# Patient Record
Sex: Male | Born: 1935
Health system: Southern US, Community
[De-identification: ages and names within clinical notes are randomized; demographics above are authoritative.]

## PROBLEM LIST (undated history)

## (undated) DIAGNOSIS — I6529 Occlusion and stenosis of unspecified carotid artery: Secondary | ICD-10-CM

## (undated) DIAGNOSIS — R1310 Dysphagia, unspecified: Secondary | ICD-10-CM

## (undated) DIAGNOSIS — Z9889 Other specified postprocedural states: Secondary | ICD-10-CM

## (undated) DIAGNOSIS — I4891 Unspecified atrial fibrillation: Secondary | ICD-10-CM

## (undated) DIAGNOSIS — R112 Nausea with vomiting, unspecified: Secondary | ICD-10-CM

## (undated) DIAGNOSIS — N400 Enlarged prostate without lower urinary tract symptoms: Secondary | ICD-10-CM

## (undated) DIAGNOSIS — G459 Transient cerebral ischemic attack, unspecified: Secondary | ICD-10-CM

## (undated) DIAGNOSIS — I10 Essential (primary) hypertension: Secondary | ICD-10-CM

## (undated) DIAGNOSIS — I219 Acute myocardial infarction, unspecified: Secondary | ICD-10-CM

## (undated) DIAGNOSIS — K219 Gastro-esophageal reflux disease without esophagitis: Secondary | ICD-10-CM

## (undated) DIAGNOSIS — E039 Hypothyroidism, unspecified: Secondary | ICD-10-CM

## (undated) DIAGNOSIS — Z87442 Personal history of urinary calculi: Secondary | ICD-10-CM

## (undated) HISTORY — PX: UPPER GASTROINTESTINAL ENDOSCOPY: SHX188

## (undated) HISTORY — PX: CORONARY ARTERY BYPASS GRAFT: SHX141

## (undated) HISTORY — DX: Gastro-esophageal reflux disease without esophagitis: K21.9

## (undated) HISTORY — DX: Occlusion and stenosis of unspecified carotid artery: I65.29

## (undated) HISTORY — DX: Transient cerebral ischemic attack, unspecified: G45.9

## (undated) HISTORY — PX: COLONOSCOPY: SHX174

## (undated) HISTORY — DX: Essential (primary) hypertension: I10

## (undated) HISTORY — DX: Acute myocardial infarction, unspecified: I21.9

## (undated) HISTORY — DX: Dysphagia, unspecified: R13.10

## (undated) HISTORY — PX: CHOLECYSTECTOMY: SHX55

---

## 2003-03-12 ENCOUNTER — Ambulatory Visit (HOSPITAL_COMMUNITY): Admission: RE | Admit: 2003-03-12 | Discharge: 2003-03-12 | Payer: Self-pay | Admitting: Internal Medicine

## 2003-05-12 HISTORY — PX: BACK SURGERY: SHX140

## 2004-05-11 HISTORY — PX: HERNIA REPAIR: SHX51

## 2008-06-13 ENCOUNTER — Ambulatory Visit: Payer: Self-pay | Admitting: Cardiology

## 2009-06-05 ENCOUNTER — Encounter: Payer: Self-pay | Admitting: Internal Medicine

## 2009-06-18 ENCOUNTER — Inpatient Hospital Stay (HOSPITAL_COMMUNITY): Admission: EM | Admit: 2009-06-18 | Discharge: 2009-07-01 | Payer: Self-pay | Admitting: Internal Medicine

## 2009-06-18 ENCOUNTER — Ambulatory Visit: Payer: Self-pay | Admitting: Cardiovascular Disease

## 2009-06-19 ENCOUNTER — Encounter: Payer: Self-pay | Admitting: Internal Medicine

## 2009-06-20 ENCOUNTER — Ambulatory Visit: Payer: Self-pay | Admitting: Cardiothoracic Surgery

## 2009-06-20 ENCOUNTER — Encounter: Payer: Self-pay | Admitting: Cardiovascular Disease

## 2009-06-23 ENCOUNTER — Encounter: Payer: Self-pay | Admitting: Cardiology

## 2009-06-25 ENCOUNTER — Encounter: Payer: Self-pay | Admitting: Internal Medicine

## 2009-07-02 ENCOUNTER — Ambulatory Visit: Payer: Self-pay | Admitting: Cardiology

## 2009-07-05 ENCOUNTER — Ambulatory Visit: Payer: Self-pay | Admitting: Cardiology

## 2009-07-05 ENCOUNTER — Encounter (INDEPENDENT_AMBULATORY_CARE_PROVIDER_SITE_OTHER): Payer: Self-pay | Admitting: *Deleted

## 2009-07-05 DIAGNOSIS — I6529 Occlusion and stenosis of unspecified carotid artery: Secondary | ICD-10-CM | POA: Insufficient documentation

## 2009-07-05 DIAGNOSIS — I10 Essential (primary) hypertension: Secondary | ICD-10-CM | POA: Insufficient documentation

## 2009-07-05 DIAGNOSIS — I219 Acute myocardial infarction, unspecified: Secondary | ICD-10-CM | POA: Insufficient documentation

## 2009-07-05 DIAGNOSIS — G459 Transient cerebral ischemic attack, unspecified: Secondary | ICD-10-CM | POA: Insufficient documentation

## 2009-07-11 ENCOUNTER — Ambulatory Visit: Payer: Self-pay | Admitting: Internal Medicine

## 2009-07-11 DIAGNOSIS — I2510 Atherosclerotic heart disease of native coronary artery without angina pectoris: Secondary | ICD-10-CM | POA: Insufficient documentation

## 2009-07-11 DIAGNOSIS — I4891 Unspecified atrial fibrillation: Secondary | ICD-10-CM | POA: Insufficient documentation

## 2009-07-12 ENCOUNTER — Ambulatory Visit: Payer: Self-pay | Admitting: Cardiology

## 2009-07-19 ENCOUNTER — Telehealth: Payer: Self-pay | Admitting: Internal Medicine

## 2009-07-19 ENCOUNTER — Ambulatory Visit: Payer: Self-pay | Admitting: Cardiology

## 2009-07-19 LAB — CONVERTED CEMR LAB: POC INR: 2

## 2009-07-25 ENCOUNTER — Telehealth (INDEPENDENT_AMBULATORY_CARE_PROVIDER_SITE_OTHER): Payer: Self-pay | Admitting: *Deleted

## 2009-07-25 ENCOUNTER — Encounter: Admission: RE | Admit: 2009-07-25 | Discharge: 2009-07-25 | Payer: Self-pay | Admitting: Cardiothoracic Surgery

## 2009-07-25 ENCOUNTER — Ambulatory Visit: Payer: Self-pay | Admitting: Cardiothoracic Surgery

## 2009-07-30 ENCOUNTER — Ambulatory Visit: Payer: Self-pay | Admitting: Cardiology

## 2009-07-30 LAB — CONVERTED CEMR LAB: POC INR: 2.2

## 2009-08-20 ENCOUNTER — Ambulatory Visit: Payer: Self-pay | Admitting: Cardiology

## 2009-08-20 LAB — CONVERTED CEMR LAB: POC INR: 1.7

## 2009-09-06 ENCOUNTER — Ambulatory Visit: Payer: Self-pay | Admitting: Cardiology

## 2009-09-26 ENCOUNTER — Encounter: Payer: Self-pay | Admitting: Internal Medicine

## 2009-10-04 ENCOUNTER — Ambulatory Visit: Payer: Self-pay | Admitting: Cardiology

## 2009-10-04 LAB — CONVERTED CEMR LAB: POC INR: 2

## 2009-10-09 ENCOUNTER — Ambulatory Visit: Payer: Self-pay | Admitting: Internal Medicine

## 2009-10-29 ENCOUNTER — Encounter: Payer: Self-pay | Admitting: Internal Medicine

## 2009-11-05 ENCOUNTER — Ambulatory Visit: Payer: Self-pay

## 2009-11-05 LAB — CONVERTED CEMR LAB: POC INR: 2.2

## 2009-11-28 ENCOUNTER — Encounter: Payer: Self-pay | Admitting: Internal Medicine

## 2009-12-03 ENCOUNTER — Ambulatory Visit: Payer: Self-pay | Admitting: Cardiology

## 2009-12-13 ENCOUNTER — Telehealth (INDEPENDENT_AMBULATORY_CARE_PROVIDER_SITE_OTHER): Payer: Self-pay

## 2009-12-19 ENCOUNTER — Ambulatory Visit: Payer: Self-pay | Admitting: Internal Medicine

## 2009-12-24 ENCOUNTER — Ambulatory Visit: Payer: Self-pay | Admitting: Cardiology

## 2010-01-21 ENCOUNTER — Ambulatory Visit: Payer: Self-pay | Admitting: Cardiology

## 2010-01-21 LAB — CONVERTED CEMR LAB: POC INR: 2.2

## 2010-02-18 ENCOUNTER — Ambulatory Visit: Payer: Self-pay | Admitting: Cardiology

## 2010-03-18 ENCOUNTER — Ambulatory Visit: Payer: Self-pay | Admitting: Cardiology

## 2010-04-15 ENCOUNTER — Ambulatory Visit: Payer: Self-pay | Admitting: Cardiology

## 2010-04-15 LAB — CONVERTED CEMR LAB: POC INR: 1.8

## 2010-05-13 ENCOUNTER — Ambulatory Visit: Admission: RE | Admit: 2010-05-13 | Discharge: 2010-05-13 | Payer: Self-pay | Source: Home / Self Care

## 2010-05-13 LAB — CONVERTED CEMR LAB: POC INR: 2.1

## 2010-06-10 ENCOUNTER — Ambulatory Visit: Admission: RE | Admit: 2010-06-10 | Discharge: 2010-06-10 | Payer: Self-pay | Source: Home / Self Care

## 2010-06-10 LAB — CONVERTED CEMR LAB: POC INR: 2.2

## 2010-06-10 NOTE — Miscellaneous (Signed)
Summary: Chronic Cardiac Discharge Summary   Chronic Cardiac Discharge Summary   Imported By: Roderic Ovens 11/29/2009 11:42:56  _____________________________________________________________________  External Attachment:    Type:   Image     Comment:   External Document

## 2010-06-10 NOTE — Medication Information (Signed)
Summary: ccr-lr  Anticoagulant Therapy  Managed by: Vashti Hey RN PCP: Celesta Gentile, MD Supervising MD: Diona Browner MD, Remi Deter Indication 1: Atrial Fibrillation Indication 2: H/O DVT / TIA Lab Used: [facility] [place] Site: [place] INR POC 1.8  Dietary changes: no    Health status changes: no    Bleeding/hemorrhagic complications: no    Recent/future hospitalizations: no    Any changes in medication regimen? no    Recent/future dental: no  Any missed doses?: no       Is patient compliant with meds? yes       Allergies: 1)  ! * Amien  Anticoagulation Management History:      The patient is taking warfarin and comes in today for a routine follow up visit.  The patient is on warfarin for chronic atrial fibrillation.  Positive risk factors for bleeding include an age of 75 years or older and history of CVA/TIA.  Negative risk factors for bleeding include no history of GI bleeding and absence of serious comorbidities.  The bleeding index is 'intermediate risk'.  Positive CHADS2 values include History of HTN, Age > 30 years old, and Prior Stroke/CVA/TIA.  Negative CHADS2 values include History of CHF and History of Diabetes.  The start date was 06/30/2009.  Plans are to continue warfarin for life.  Anticoagulation responsible provider: Diona Browner MD, Remi Deter.  INR POC: 1.8.  Cuvette Lot#: [id number].    Anticoagulation Management Assessment/Plan:      The patient's current anticoagulation dose is Warfarin sodium 5 mg tabs: Use as directed by Anticoagulation Clinic.  The target INR is 2.0-3.0.  The next INR is due 12/02/2009.  Anticoagulation instructions were given to patient.  Results were reviewed/authorized by Vashti Hey RN.  He was notified by Vashti Hey RN.         Prior Anticoagulation Instructions: INR 2.2 Continue coumadin 2.5mg  once daily except 5mg  on Tuesdays and Fridays  Current Anticoagulation Instructions: INR 1.8 Increase coumadin to 2.5mg  once daily except  5mg  on T,Th,Sat

## 2010-06-10 NOTE — Assessment & Plan Note (Signed)
Summary: 3 month rov   Visit Type:  Follow-up Primary Provider:  Celesta Gentile, MD  CC:  nb cardiology complaints.  History of Present Illness: Danny Lucas is a pleasant 77 yo man from [place], [place] who has a h/o CAD and atrial fibrillation who underwent CABG/MAZE several weeks ago.  He has been stable post op and denies c/p, sob, or recurrent symptomatic atrial fib.  He has remained on anti-arrrhythmic medical therapy.  No syncope.  He has slowly increased his activity and is walking 10 minutes three times daily.  He has minimal peripheral edema after his CABG.  He want to come off of coumadin.   Current Medications (verified): 1)  Amiodarone Hcl 200 Mg Tabs (Amiodarone Hcl) .... Take One Tablet By Mouth Daily 2)  Aspirin 81 Mg Tbec (Aspirin) .... Take One Tablet By Mouth Daily 3)  Simvastatin 20 Mg Tabs (Simvastatin) .... One By Mouth Every Night 4)  Warfarin Sodium 5 Mg Tabs (Warfarin Sodium) .... Use As Directed By Anticoagulation Clinic 5)  Folic Acid 1 Mg Tabs (Folic Acid) .... Take One Tablet By Mouth Once Daily. 6)  Cinnamon 1000 Mg Caps (Cinnamon) .... Take One Tablet By Mouth Once Daily. 7)  Doxazosin Mesylate 4 Mg Tabs (Doxazosin Mesylate) .... Take One Tablet By Mouth Once Daily. 8)  Gabapentin 300 Mg Caps (Gabapentin) .... 2 Tabs Once Daily 9)  Metoprolol Tartrate 50 Mg Tabs (Metoprolol Tartrate) .... Take One Tablet By Mouth Twice A Day  Allergies (verified): 1)  ! Danny Lucas  Past History:  Past Medical History: Last updated: 07/05/2009 Current Problems:  CAROTID ARTERY STENOSIS (ICD-433.10) TRANSIENT ISCHEMIC ATTACK (ICD-435.9) HYPERTENSION (ICD-401.9) MI (ICD-410.90)    Past Surgical History: Last updated: 07/05/2009  DATE OF PROCEDURE:  06/24/2009  PHYSICIAN:  Sheliah Plane, MD   SURGICAL PROCEDURES:  Coronary artery bypass grafting x6 with the left   internal mammary to the left anterior descending coronary artery,   reverse saphenous vein graft to the  intermediate coronary artery,   sequential reverse saphenous vein graft to the first obtuse marginal and   distal circumflex, sequential reverse saphenous vein graft to the acute   marginal and posterior descending coronary artery branch of the right   coronary artery with left-sided Maze and stapling, obliteration of the   left atrial appendage and placement of right femoral arterial line.      Review of Systems  The patient denies chest pain, syncope, dyspnea on exertion, and peripheral edema.    Vital Signs:  Patient profile:   77 year old male Pulse rate:   59 / minute BP sitting:   127 / 69  (right arm)  Vitals Entered By: Larita Fife Via LPN (October 09, 3084 2:19 PM)  Physical Exam  General:  Elderly, well developed, well nourished, in no acute distress.  HEENT: normal Neck: supple. No JVD. Carotids 2+ bilaterally no bruits Cor: RRR no rubs, gallops or murmur. Well healed sternotomy incision. Lungs: CTA Ab: soft, nontender. nondistended. No HSM. Good bowel sounds Ext: warm. no cyanosis or clubbing. Trace edema Neuro: alert and oriented. Grossly nonfocal. affect pleasant    Impression & Recommendations:  Problem # 1:  ATRIAL FIBRILLATION (ICD-427.31) He appears to be maintaining NSR in atrial fib on coumadin.  I have asked him to continue his current meds as below.  I do not think we should stop his amiodarone or his coumadin at this time.  I might consider stopping one or the other in one year.  His updated medication list for this problem includes:    Amiodarone Hcl 200 Mg Tabs (Amiodarone hcl) .Marland Kitchen... Take one tablet by mouth daily    Aspirin 81 Mg Tbec (Aspirin) .Marland Kitchen... Take one tablet by mouth daily    Warfarin Sodium 5 Mg Tabs (Warfarin sodium) ..... Use as directed by anticoagulation clinic    Metoprolol Tartrate 50 Mg Tabs (Metoprolol tartrate) .Marland Kitchen... Take one tablet by mouth twice a day  Problem # 2:  CAD (ICD-414.00) He denies anginal symptoms.  Will continue his current  meds as below and start walking. His updated medication list for this problem includes:    Aspirin 81 Mg Tbec (Aspirin) .Marland Kitchen... Take one tablet by mouth daily    Warfarin Sodium 5 Mg Tabs (Warfarin sodium) ..... Use as directed by anticoagulation clinic    Metoprolol Tartrate 50 Mg Tabs (Metoprolol tartrate) .Marland Kitchen... Take one tablet by mouth twice a day  Problem # 3:  HYPERTENSION (ICD-401.9) A low sodium diet is requested along with meds as below. His updated medication list for this problem includes:    Aspirin 81 Mg Tbec (Aspirin) .Marland Kitchen... Take one tablet by mouth daily    Doxazosin Mesylate 4 Mg Tabs (Doxazosin mesylate) .Marland Kitchen... Take one tablet by mouth once daily.    Metoprolol Tartrate 50 Mg Tabs (Metoprolol tartrate) .Marland Kitchen... Take one tablet by mouth twice a day

## 2010-06-10 NOTE — Medication Information (Signed)
Summary: ccr-lr  Anticoagulant Therapy  Managed by: Vashti Hey RN PCP: Celesta Gentile, MD Supervising MD: Andee Lineman MD, Michelle Piper Indication 1: Atrial Fibrillation Indication 2: H/O DVT / TIA Lab Used: [facility] [place] Site: [place] INR POC 2.8  Dietary changes: no    Health status changes: no    Bleeding/hemorrhagic complications: no    Recent/future hospitalizations: no    Any changes in medication regimen? no    Recent/future dental: no  Any missed doses?: no       Is patient compliant with meds? yes       Allergies: 1)  ! * Amien  Anticoagulation Management History:      The patient is taking warfarin and comes in today for a routine follow up visit.  The patient is taking warfarin for chronic atrial fibrillation.  Positive risk factors for bleeding include an age of 78 years or older and history of CVA/TIA.  Negative risk factors for bleeding include no history of GI bleeding and absence of serious comorbidities.  The bleeding index is 'intermediate risk'.  Positive CHADS2 values include History of HTN, Age > 40 years old, and Prior Stroke/CVA/TIA.  Negative CHADS2 values include History of CHF and History of Diabetes.  The start date was 06/30/2009.  Plans are to continue warfarin for life.  Anticoagulation responsible provider: Andee Lineman MD, Michelle Piper.  INR POC: 2.8.  Cuvette Lot#: [id number].    Anticoagulation Management Assessment/Plan:      The patient's current anticoagulation dose is Warfarin sodium 5 mg tabs: Use as directed by Anticoagulation Clinic.  The target INR is 2.0-3.0.  The next INR is due 07/19/2009.  Anticoagulation instructions were given to patient.  Results were reviewed/authorized by Vashti Hey RN.  He was notified by Vashti Hey RN.        Coagulation management information includes: 06/30/09  On amiodarone 400mg  two times a day x 10 days (07/10/09) then 200mg  two times a day    May switch to Dr Leandrew Koyanagi for coumadin management after INR's stable.  Prior  Anticoagulation Instructions: INR 3.0 Decrease coumadin to 2.5mg  once daily   Current Anticoagulation Instructions: INR 2.8 Continue coumadin 2.5mg  once daily

## 2010-06-10 NOTE — Medication Information (Signed)
Summary: ccr-lr  Anticoagulant Therapy  Managed by: Vashti Hey RN PCP: Celesta Gentile, MD Supervising MD: Andee Lineman MD, Michelle Piper Indication 1: Atrial Fibrillation Indication 2: H/O DVT / TIA Lab Used: [facility] [place] Site: [place] INR POC 2.2  Dietary changes: no    Health status changes: no    Bleeding/hemorrhagic complications: no    Recent/future hospitalizations: no    Any changes in medication regimen? no    Recent/future dental: no  Any missed doses?: no       Is patient compliant with meds? yes       Allergies: 1)  ! * Amien  Anticoagulation Management History:      The patient is taking warfarin and comes in today for a routine follow up visit.  The patient is taking warfarin for chronic atrial fibrillation.  Positive risk factors for bleeding include an age of 75 years or older and history of CVA/TIA.  Negative risk factors for bleeding include no history of GI bleeding and absence of serious comorbidities.  The bleeding index is 'intermediate risk'.  Positive CHADS2 values include History of HTN, Age > 62 years old, and Prior Stroke/CVA/TIA.  Negative CHADS2 values include History of CHF and History of Diabetes.  The start date was 06/30/2009.  Plans are to continue warfarin for life.  Anticoagulation responsible provider: Andee Lineman MD, Michelle Piper.  INR POC: 2.2.  Cuvette Lot#: [id number].    Anticoagulation Management Assessment/Plan:      The patient's current anticoagulation dose is Warfarin sodium 5 mg tabs: Use as directed by Anticoagulation Clinic.  The target INR is 2.0-3.0.  The next INR is due 08/20/2009.  Anticoagulation instructions were given to patient.  Results were reviewed/authorized by Vashti Hey RN.  He was notified by Vashti Hey RN.        Coagulation management information includes: 06/30/09  On amiodarone 200mg  qd    May switch to Dr Leandrew Koyanagi for coumadin management after INR's stable.  Has appt with him 09/07/09.  Prior Anticoagulation Instructions: INR  2.0 Increase coumadin to 2.5mg  once daily except 5mg  on Fridays  Current Anticoagulation Instructions: INR 2.2 Continue coumadin 2.5mg  once daily except 5mg  on Fridays

## 2010-06-10 NOTE — Letter (Signed)
Summary: Engineer, materials at [facility]  [address]   [place], [place] [address]   Phone: [phone]  Fax: [phone]        July 05, 2009 MRN: [id number]   ETHAN CLAYBURN [address] [place], [place]  [address]   Dear Mr. VICENCIO,  Your test ordered by Selena Batten has been reviewed by your physician (or physician assistant) and was found to be normal or stable. Your physician (or physician assistant) felt no changes were needed at this time.  ____ Echocardiogram  ____ Cardiac Stress Test  ____ Lab Work  ____ Peripheral vascular study of arms, legs or neck  __X__ CT scan or X-ray (chest)  ____ Lung or Breathing test  ____ Other:   Thank you.   Hoover Brunette, LPN    Duane Boston, M.D., F.A.C.C. Thressa Sheller, M.D., F.A.C.C. Oneal Grout, M.D., F.A.C.C. Cheree Ditto, M.D., F.A.C.C. Daiva Nakayama, M.D., F.A.C.C. Kenney Houseman, M.D., F.A.C.C. Jeanne Ivan, PA-C

## 2010-06-10 NOTE — Medication Information (Signed)
Summary: ccr-lr  Anticoagulant Therapy  Managed by: Vashti Hey RN Supervising MD: Myrtis Ser MD, Tinnie Gens Indication 1: Atrial Fibrillation Indication 2: H/O DVT / TIA Lab Used: [facility] [place] Site: [place] INR POC 3.0  Dietary changes: no    Health status changes: no    Bleeding/hemorrhagic complications: no    Recent/future hospitalizations: yes       Details: s/p cabg  Any changes in medication regimen? yes       Details: on amiodarone and coumadin   Recent/future dental: no  Any missed doses?: no       Is patient compliant with meds? yes       Anticoagulation Management History:      The patient is taking warfarin and comes in today for a routine follow up visit.  Anticoagulation is being administered due to chronic atrial fibrillation.  Positive risk factors for bleeding include an age of 50 years or older and history of CVA/TIA.  Negative risk factors for bleeding include no history of GI bleeding and absence of serious comorbidities.  The bleeding index is 'intermediate risk'.  Positive CHADS2 values include History of HTN, Age > 63 years old, and Prior Stroke/CVA/TIA.  Negative CHADS2 values include History of CHF and History of Diabetes.  The start date was 06/30/2009.  Plans are to continue warfarin for life.  Anticoagulation responsible provider: Myrtis Ser MD, Tinnie Gens.  INR POC: 3.0.  Cuvette Lot#: [id number].    Anticoagulation Management Assessment/Plan:      The target INR is 2.0-3.0.  The next INR is due 07/12/2009.  Anticoagulation instructions were given to patient.  Results were reviewed/authorized by Vashti Hey RN.  He was notified by Vashti Hey RN.         Prior Anticoagulation Instructions: INR 2.4 Sent home from hospital on 5mg  once daily.  D/C INR 1.5 on 06/30/09 Take coumadin 2.5mg  Tuesday, 5mg  on Wednesday and 2.5mg  on Thursday.  Recheck INR on Friday 07/05/09. On amiodarone as above.  Current Anticoagulation Instructions: INR 3.0 Decrease coumadin to  2.5mg  once daily

## 2010-06-10 NOTE — Letter (Signed)
Summary: [facility]  [facility]   Imported By: Marylou Mccoy 10/31/2009 11:46:44  _____________________________________________________________________  External Attachment:    Type:   Image     Comment:   External Document

## 2010-06-10 NOTE — Assessment & Plan Note (Signed)
Summary: EPH   Visit Type:  Follow-up Primary Provider:  Celesta Gentile, MD   History of Present Illness: Danny Lucas is seen for the first time in our cardiology office.  He is a pleasant 77 yo man from [place], [place] who has a h/o CAD and atrial fibrillation who underwent CABG/MAZE several weeks ago.  He has been stable post op and denies c/p, sob, or recurrent symptomatic atrial fib.  He has remained on anti-arrrhythmic medical therapy.  No syncope.  He has slowly increased his activity and is walking 10 minutes three times daily.  He has minimal peripheral edema after his CABG.  Current Medications (verified): 1)  Amiodarone Hcl 200 Mg Tabs (Amiodarone Hcl) .... Take One Tablet By Mouth Twice A Day 2)  Aspirin 81 Mg Tbec (Aspirin) .... Take One Tablet By Mouth Daily 3)  Crestor 10 Mg Tabs (Rosuvastatin Calcium) .... Take One Tablet By Mouth Daily. 4)  Warfarin Sodium 5 Mg Tabs (Warfarin Sodium) .... Use As Directed By Anticoagulation Clinic 5)  Folic Acid 1 Mg Tabs (Folic Acid) .... Take One Tablet By Mouth Once Daily. 6)  Cinnamon 1000 Mg Caps (Cinnamon) .... Take One Tablet By Mouth Once Daily. 7)  Doxazosin Mesylate 4 Mg Tabs (Doxazosin Mesylate) .... Take One Tablet By Mouth Once Daily. 8)  Gabapentin 300 Mg Caps (Gabapentin) .... 2 Tabs Once Daily 9)  Metoprolol Tartrate 50 Mg Tabs (Metoprolol Tartrate) .... Take One Tablet By Mouth Twice A Day  Allergies (verified): 1)  ! Carmela Hurt  Past History:  Past Medical History: Last updated: 07/05/2009 Current Problems:  CAROTID ARTERY STENOSIS (ICD-433.10) TRANSIENT ISCHEMIC ATTACK (ICD-435.9) HYPERTENSION (ICD-401.9) MI (ICD-410.90)    Past Surgical History: Last updated: 07/05/2009  DATE OF PROCEDURE:  06/24/2009  PHYSICIAN:  Sheliah Plane, MD   SURGICAL PROCEDURES:  Coronary artery bypass grafting x6 with the left   internal mammary to the left anterior descending coronary artery,   reverse saphenous vein graft to the  intermediate coronary artery,   sequential reverse saphenous vein graft to the first obtuse marginal and   distal circumflex, sequential reverse saphenous vein graft to the acute   marginal and posterior descending coronary artery branch of the right   coronary artery with left-sided Maze and stapling, obliteration of the   left atrial appendage and placement of right femoral arterial line.      Review of Systems  The patient denies chest pain, syncope, dyspnea on exertion, and peripheral edema.    Vital Signs:  Patient profile:   77 year old male Height:      69 inches Weight:      181 pounds BMI:     26.83 Pulse (ortho):   71 / minute BP sitting:   100 / 70  (left arm)  Vitals Entered By: Laurance Flatten CMA (July 11, 2009 9:02 AM)  Physical Exam  General:  Elderly, well developed, well nourished, in no acute distress.  HEENT: normal Neck: supple. No JVD. Carotids 2+ bilaterally no bruits Cor: RRR no rubs, gallops or murmur. Well healed sternotomy incision. Lungs: CTA Ab: soft, nontender. nondistended. No HSM. Good bowel sounds Ext: warm. no cyanosis or clubbing. Trace edema Neuro: alert and oriented. Grossly nonfocal. affect pleasant    EKG  Procedure date:  07/11/2009  Findings:      Normal sinus rhythm with rate of:71.  Significant T-wave inversion noted:     Impression & Recommendations:  Problem # 1:  ATRIAL FIBRILLATION (ICD-427.31) His  ECG demonstrates NSR today.  He will continue his amiodarone and decrease his dose to 200 mg daily. His updated medication list for this problem includes:    Amiodarone Hcl 200 Mg Tabs (Amiodarone hcl) .Marland Kitchen... Take one tablet by mouth daily    Aspirin 81 Mg Tbec (Aspirin) .Marland Kitchen... Take one tablet by mouth daily    Warfarin Sodium 5 Mg Tabs (Warfarin sodium) ..... Use as directed by anticoagulation clinic    Metoprolol Tartrate 50 Mg Tabs (Metoprolol tartrate) .Marland Kitchen... Take one tablet by mouth twice a day  Problem # 2:  CAD  (ICD-414.00) He is stable s/p CABG.  He will continue his current meds.  I have encouraged him to advance his physical activity as he is able.  He is interested in cardiac rehab. His updated medication list for this problem includes:    Aspirin 81 Mg Tbec (Aspirin) .Marland Kitchen... Take one tablet by mouth daily    Warfarin Sodium 5 Mg Tabs (Warfarin sodium) ..... Use as directed by anticoagulation clinic    Metoprolol Tartrate 50 Mg Tabs (Metoprolol tartrate) .Marland Kitchen... Take one tablet by mouth twice a day  Other Orders: EKG w/ Interpretation (93000)  Patient Instructions: 1)  Your physician recommends that you schedule a follow-up appointment in: 3 months with Dr Ladona Ridgel in [place] 2)  Your physician has recommended you make the following change in your medication: decrease Amiodarone to 200mg  once daily and change Crestor to Simvastatin 20mg  one by mouth every night Prescriptions: METOPROLOL TARTRATE 50 MG TABS (METOPROLOL TARTRATE) Take one tablet by mouth twice a day  #180 x 3   Entered by:   Dennis Bast, RN, BSN   Authorized by:   Laren Boom, MD, [facility]   Signed by:   Dennis Bast, RN, BSN on 07/11/2009   Method used:   Faxed to ...       [facility] (mail-order)             , [place]         Ph: [phone]       Fax: [phone]   RxID:   [id number] AMIODARONE HCL 200 MG TABS (AMIODARONE HCL) Take one tablet by mouth daily  #90 x 3   Entered by:   Dennis Bast, RN, BSN   Authorized by:   Laren Boom, MD, [facility]   Signed by:   Dennis Bast, RN, BSN on 07/11/2009   Method used:   Faxed to ...       [facility] (mail-order)             , [place]         Ph: [phone]       Fax: [phone]   RxID:   [id number] SIMVASTATIN 20 MG TABS (SIMVASTATIN) one by mouth every night  #90 x 3   Entered by:   Dennis Bast, RN, BSN   Authorized by:   Laren Boom, MD, [facility]   Signed by:   Dennis Bast, RN, BSN on 07/11/2009   Method used:   Faxed to ...        [facility] (mail-order)             , [place]         Ph: [phone]       Fax: [phone]   RxID:   [id number] METOPROLOL TARTRATE 50 MG TABS (METOPROLOL TARTRATE) Take one tablet by mouth twice a day  #180 x 3   Entered by:  Dennis Bast, RN, BSN   Authorized by:   Laren Boom, MD, [facility]   Signed by:   Dennis Bast, RN, BSN on 07/11/2009   Method used:   Print then Give to Patient   RxID:   [id number] SIMVASTATIN 20 MG TABS (SIMVASTATIN) one by mouth every night  #90 x 3   Entered by:   Dennis Bast, RN, BSN   Authorized by:   Laren Boom, MD, [facility]   Signed by:   Dennis Bast, RN, BSN on 07/11/2009   Method used:   Print then Give to Patient   RxID:   [id number] AMIODARONE HCL 200 MG TABS (AMIODARONE HCL) Take one tablet by mouth daily  #90 x 3   Entered by:   Dennis Bast, RN, BSN   Authorized by:   Laren Boom, MD, [facility]   Signed by:   Dennis Bast, RN, BSN on 07/11/2009   Method used:   Print then Give to Patient   RxID:   [id number]   Appended Document: EPH    Clinical Lists Changes  Medications: Rx of AMIODARONE HCL 200 MG TABS (AMIODARONE HCL) Take one tablet by mouth daily;  #90 x 3;  Signed;  Entered by: Teressa Lower RN;  Authorized by: Laren Boom, MD, [facility];  Method used: Print then Give to Patient Rx of SIMVASTATIN 20 MG TABS (SIMVASTATIN) one by mouth every night;  #90 x 3;  Signed;  Entered by: Teressa Lower RN;  Authorized by: Laren Boom, MD, [facility];  Method used: Print then Give to Patient Rx of METOPROLOL TARTRATE 50 MG TABS (METOPROLOL TARTRATE) Take one tablet by mouth twice a day;  #180 x 3;  Signed;  Entered by: Teressa Lower RN;  Authorized by: Laren Boom, MD, [facility];  Method used: Print then Give to Patient    Prescriptions: METOPROLOL TARTRATE 50 MG TABS (METOPROLOL TARTRATE) Take one tablet by mouth twice a day  #180 x 3   Entered by:   Teressa Lower RN   Authorized  by:   Laren Boom, MD, [facility]   Signed by:   Teressa Lower RN on 07/17/2009   Method used:   Print then Give to Patient   RxID:   [id number] SIMVASTATIN 20 MG TABS (SIMVASTATIN) one by mouth every night  #90 x 3   Entered by:   Teressa Lower RN   Authorized by:   Laren Boom, MD, [facility]   Signed by:   Teressa Lower RN on 07/17/2009   Method used:   Print then Give to Patient   RxID:   [id number] AMIODARONE HCL 200 MG TABS (AMIODARONE HCL) Take one tablet by mouth daily  #90 x 3   Entered by:   Teressa Lower RN   Authorized by:   Laren Boom, MD, [facility] [place] [facility]   Signed by:   Teressa Lower RN on 07/17/2009   Method used:   Print then Give to Patient   RxID:   [id number]

## 2010-06-10 NOTE — Medication Information (Signed)
Summary: ccr-lr  Anticoagulant Therapy  Managed by: Vashti Hey RN PCP: Celesta Gentile, MD Supervising MD: Andee Lineman MD, Michelle Piper Indication 1: Atrial Fibrillation Indication 2: H/O DVT / TIA Lab Used: [facility] [place] Site: [place] INR POC 1.8  Dietary changes: no    Health status changes: no    Bleeding/hemorrhagic complications: no    Recent/future hospitalizations: no    Any changes in medication regimen? no    Recent/future dental: no  Any missed doses?: no       Is patient compliant with meds? yes       Allergies: 1)  ! * Amien  Anticoagulation Management History:      The patient is taking warfarin and comes in today for a routine follow up visit.  He is being anticoagulated because of chronic atrial fibrillation.  Positive risk factors for bleeding include an age of 75 years or older and history of CVA/TIA.  Negative risk factors for bleeding include no history of GI bleeding and absence of serious comorbidities.  The bleeding index is 'intermediate risk'.  Positive CHADS2 values include History of HTN, Age > 2 years old, and Prior Stroke/CVA/TIA.  Negative CHADS2 values include History of CHF and History of Diabetes.  The start date was 06/30/2009.  Plans are to continue warfarin for life.  Anticoagulation responsible provider: Andee Lineman MD, Michelle Piper.  INR POC: 1.8.  Cuvette Lot#: [id number].    Anticoagulation Management Assessment/Plan:      The patient's current anticoagulation dose is Warfarin sodium 5 mg tabs: Use as directed by Anticoagulation Clinic.  The target INR is 2.0-3.0.  The next INR is due 04/15/2010.  Anticoagulation instructions were given to patient.  Results were reviewed/authorized by Vashti Hey RN.  He was notified by Vashti Hey RN.         Prior Anticoagulation Instructions: INR 2.3 Continue coumadin 2.5mg  once daily except 5mg  on T,Th,Sat  Current Anticoagulation Instructions: INR 1.8 Take coumadin 1 tablet tonight then resume 1/2 tablet once  daily except 1 tablet on T,Th,Sat

## 2010-06-10 NOTE — Medication Information (Signed)
Summary: [facility] 2/21 DR. TAYLOR  Anticoagulant Therapy  Managed by: Vashti Hey RN Supervising MD: Andee Lineman MD, Michelle Piper Indication 1: Atrial Fibrillation Indication 2: H/O DVT / TIA Lab Used: [facility] [place] Site: [place] INR POC 2.4  Dietary changes: no    Health status changes: no    Bleeding/hemorrhagic complications: no    Recent/future hospitalizations: yes       Details: S/P [facility]  D/C 06/30/09  S/P CABG X 6   Atrial fib  Any changes in medication regimen? yes       Details: started on coumadin 5mg  qd and amiodarone   Recent/future dental: no  Any missed doses?: no       Is patient compliant with meds? yes      Comments: Pt discharged from [facility] on 2/20 for atrial fib.  Has H/O DVT and TIA  Discharged home on coumadin 5mg  once daily  Discharge INR on 2/20 was 1.5.  Pt was also started on amiodarone.  Coumadin teaching in hospital but reemphasized indications, adverse effects and food/drug interactions with pt/family.   Anticoagulation Management History:      The patient comes in today for his initial visit for anticoagulation therapy.  Anticoagulation is being administered due to chronic atrial fibrillation.  Positive risk factors for bleeding include an age of 41 years or older and history of CVA/TIA.  Negative risk factors for bleeding include no history of GI bleeding and absence of serious comorbidities.  The bleeding index is 'intermediate risk'.  Positive CHADS2 values include History of HTN, Age > 82 years old, and Prior Stroke/CVA/TIA.  Negative CHADS2 values include History of CHF and History of Diabetes.  The start date was 06/30/2009.  Plans are to continue warfarin for life.  Anticoagulation responsible provider: Andee Lineman MD, Michelle Piper.  INR POC: 2.4.  Cuvette Lot#: [id number].    Anticoagulation Management Assessment/Plan:      The target INR is 2.0-3.0.  The next INR is due 07/05/2009.  Anticoagulation instructions were given to patient.  Results were  reviewed/authorized by Vashti Hey RN.  He was notified by Vashti Hey RN.        Coagulation management information includes: 06/30/09  On amiodarone 400mg  two times a day x 10 days (07/10/09) then 200mg  two times a day    May switch to Dr Leandrew Koyanagi for coumadin management after INR's stable.  Current Anticoagulation Instructions: INR 2.4 Sent home from hospital on 5mg  once daily.  D/C INR 1.5 on 06/30/09 Take coumadin 2.5mg  Tuesday, 5mg  on Wednesday and 2.5mg  on Thursday.  Recheck INR on Friday 07/05/09. On amiodarone as above.

## 2010-06-10 NOTE — Progress Notes (Signed)
Summary: pt needs refill  Phone Note Refill Request Call back at Home Phone [phone] Message from:  [facility]  Refills Requested: Medication #1:  DOXAZOSIN MESYLATE 4 MG TABS Take one tablet by mouth once daily.  Medication #2:  FOLIC ACID 1 MG TABS Take one tablet by mouth once daily. Initial call taken by: Omer Jack,  July 25, 2009 4:32 PM  Follow-up for Phone Call        PT CALLING ABOUT GETTING A REFILL OF THETS TWO MEDICATIONS DOXAZOSIN MESYLATE 4MG  NAD FOLIC ACID 1MG  NEED IT CALLED IN ASAP AND THE THE PT WANT A 10 DAY CALLED INTO [facility] IN [place] [phone] AND THEN THE OTHER CALLED IN TO [facility] Judie Grieve  July 26, 2009 9:32 AM     Prescriptions: DOXAZOSIN MESYLATE 4 MG TABS (DOXAZOSIN MESYLATE) Take one tablet by mouth once daily.  #90 x 0   Entered by:   Laurance Flatten CMA   Authorized by:   Laren Boom, MD, [facility]   Signed by:   Laurance Flatten CMA on 07/26/2009   Method used:   Historical   RxID:   [id number] FOLIC ACID 1 MG TABS (FOLIC ACID) Take one tablet by mouth once daily.  #90 x 3   Entered by:   Laurance Flatten CMA   Authorized by:   Laren Boom, MD, [facility]   Signed by:   Laurance Flatten CMA on 07/26/2009   Method used:   Historical   RxID:   [id number]

## 2010-06-10 NOTE — Medication Information (Signed)
Summary: ccr-lr  Anticoagulant Therapy  Managed by: Vashti Hey RN PCP: Celesta Gentile, MD Supervising MD: Diona Browner MD, Remi Deter Indication 1: Atrial Fibrillation Indication 2: H/O DVT / TIA Lab Used: [facility] [place] Site: [place] INR POC 2.0  Dietary changes: no    Health status changes: no    Bleeding/hemorrhagic complications: no    Recent/future hospitalizations: no    Any changes in medication regimen? no    Recent/future dental: no  Any missed doses?: no       Is patient compliant with meds? yes       Allergies: 1)  ! * Amien  Anticoagulation Management History:      The patient is taking warfarin and comes in today for a routine follow up visit.  The patient is taking warfarin for chronic atrial fibrillation.  Positive risk factors for bleeding include an age of 7 years or older and history of CVA/TIA.  Negative risk factors for bleeding include no history of GI bleeding and absence of serious comorbidities.  The bleeding index is 'intermediate risk'.  Positive CHADS2 values include History of HTN, Age > 20 years old, and Prior Stroke/CVA/TIA.  Negative CHADS2 values include History of CHF and History of Diabetes.  The start date was 06/30/2009.  Plans are to continue warfarin for life.  Anticoagulation responsible provider: Diona Browner MD, Remi Deter.  INR POC: 2.0.  Cuvette Lot#: [id number].    Anticoagulation Management Assessment/Plan:      The patient's current anticoagulation dose is Warfarin sodium 5 mg tabs: Use as directed by Anticoagulation Clinic.  The target INR is 2.0-3.0.  The next INR is due 07/30/2009.  Anticoagulation instructions were given to patient.  Results were reviewed/authorized by Vashti Hey RN.  He was notified by Vashti Hey RN.         Prior Anticoagulation Instructions: INR 2.8 Continue coumadin 2.5mg  once daily   Current Anticoagulation Instructions: INR 2.0 Increase coumadin to 2.5mg  once daily except 5mg  on Fridays

## 2010-06-10 NOTE — Medication Information (Signed)
Summary: ccr  Anticoagulant Therapy  Managed by: Vashti Hey RN PCP: Celesta Gentile, MD Supervising MD: Andee Lineman MD, Michelle Piper Indication 1: Atrial Fibrillation Indication 2: H/O DVT / TIA Lab Used: [facility] [place] Site: [place] INR POC 1.7  Dietary changes: no    Health status changes: no    Bleeding/hemorrhagic complications: no    Recent/future hospitalizations: no    Any changes in medication regimen? no    Recent/future dental: no  Any missed doses?: no       Is patient compliant with meds? yes       Allergies: 1)  ! * Amien  Anticoagulation Management History:      The patient is taking warfarin and comes in today for a routine follow up visit.  The patient is taking warfarin for chronic atrial fibrillation.  Positive risk factors for bleeding include an age of 24 years or older and history of CVA/TIA.  Negative risk factors for bleeding include no history of GI bleeding and absence of serious comorbidities.  The bleeding index is 'intermediate risk'.  Positive CHADS2 values include History of HTN, Age > 62 years old, and Prior Stroke/CVA/TIA.  Negative CHADS2 values include History of CHF and History of Diabetes.  The start date was 06/30/2009.  Plans are to continue warfarin for life.  Anticoagulation responsible provider: Andee Lineman MD, Michelle Piper.  INR POC: 1.7.  Cuvette Lot#: [id number].    Anticoagulation Management Assessment/Plan:      The patient's current anticoagulation dose is Warfarin sodium 5 mg tabs: Use as directed by Anticoagulation Clinic.  The target INR is 2.0-3.0.  The next INR is due 09/06/2009.  Anticoagulation instructions were given to patient.  Results were reviewed/authorized by Vashti Hey RN.  He was notified by Vashti Hey RN.         Prior Anticoagulation Instructions: INR 2.2 Continue coumadin 2.5mg  once daily except 5mg  on Fridays  Current Anticoagulation Instructions: INR 1.7 Take coumadin 7.5mg  tonight then increase coumadin to 2.5mg  once  daily except 5mg  on Tuesdays and Fridays

## 2010-06-10 NOTE — Progress Notes (Signed)
Summary: PT FEELS DRUNK ALL THE TIME/HE IS OIN NEW MED  Phone Note Call from Patient Call back at Home Phone [phone]   Caller: PT Reason for Call: Talk to Nurse Summary of Call: PT WAS RECENTLY PUT ON SIMVASTAIN. HE FEELS DRUNK ALL THE TIME CAN NOT KEEP BALANCE AND HAS TROUBLE STANDING. WANTS TO KNOW IF THIS MEDICATION COULD BE THE CAUSE OF IT. Initial call taken by: Faythe Ghee,  December 13, 2009 2:00 PM  Follow-up for Phone Call        Pt. was started on Simvastatin 20mg  by mouth once daily on 07-11-09 OV with Dr. Ladona Ridgel. Pt. states that he has been "falling over real easy" for about a month and pt's wife thinks it is due to Simvastatin. He states he has not made any medication or diet changes. Pt. wants to talk with Dr. Ladona Ridgel concerning matter. Appt. scheduled for 12-19-09. Follow-up by: Larita Fife Via LPN,  December 13, 2009 3:41 PM

## 2010-06-10 NOTE — Medication Information (Signed)
Summary: ccr-lr  Anticoagulant Therapy  Managed by: Vashti Hey RN PCP: Celesta Gentile, MD Supervising MD: Diona Browner MD, Remi Deter Indication 1: Atrial Fibrillation Indication 2: H/O DVT / TIA Lab Used: [facility] [place] Site: [place] INR POC 2.0  Dietary changes: no    Health status changes: no    Bleeding/hemorrhagic complications: no    Recent/future hospitalizations: no    Any changes in medication regimen? no    Recent/future dental: no  Any missed doses?: no       Is patient compliant with meds? yes       Allergies: 1)  ! * Amien  Anticoagulation Management History:      The patient is taking warfarin and comes in today for a routine follow up visit.  The patient is taking warfarin for chronic atrial fibrillation.  Positive risk factors for bleeding include an age of 75 years or older and history of CVA/TIA.  Negative risk factors for bleeding include no history of GI bleeding and absence of serious comorbidities.  The bleeding index is 'intermediate risk'.  Positive CHADS2 values include History of HTN, Age > 75 years old, and Prior Stroke/CVA/TIA.  Negative CHADS2 values include History of CHF and History of Diabetes.  The start date was 06/30/2009.  Plans are to continue warfarin for life.  Anticoagulation responsible provider: Diona Browner MD, Remi Deter.  INR POC: 2.0.  Cuvette Lot#: [id number].    Anticoagulation Management Assessment/Plan:      The patient's current anticoagulation dose is Warfarin sodium 5 mg tabs: Use as directed by Anticoagulation Clinic.  The target INR is 2.0-3.0.  The next INR is due 11/05/2009.  Anticoagulation instructions were given to patient.  Results were reviewed/authorized by Vashti Hey RN.  He was notified by Vashti Hey RN.         Prior Anticoagulation Instructions: INR 2.3 Continue coumadin 2.5mg  once daily except 5mg  on Tuesdays and Fridays  Current Anticoagulation Instructions: INR 2.0 Take coumadin 7.5mg  tonight then resume  2.5mg  once daily except 5mg  on Tuesdays and Fridays

## 2010-06-10 NOTE — Medication Information (Signed)
Summary: ccr-lr  Anticoagulant Therapy  Managed by: Vashti Hey RN PCP: Celesta Gentile, MD Supervising MD: Antoine Poche MD, Fayrene Fearing Indication 1: Atrial Fibrillation Indication 2: H/O DVT / TIA Lab Used: [facility] [place] Site: [place] INR POC 2.3  Dietary changes: no    Health status changes: no    Bleeding/hemorrhagic complications: no    Recent/future hospitalizations: no    Any changes in medication regimen? yes       Details: amiodarone decreased to 200mg  qd except 100mg  Fri - Sunday  Recent/future dental: no  Any missed doses?: no       Is patient compliant with meds? yes       Allergies: 1)  ! * Amien  Anticoagulation Management History:      The patient is taking warfarin and comes in today for a routine follow up visit.  He is being anticoagulated due to chronic atrial fibrillation.  Positive risk factors for bleeding include an age of 75 years or older and history of CVA/TIA.  Negative risk factors for bleeding include no history of GI bleeding and absence of serious comorbidities.  The bleeding index is 'intermediate risk'.  Positive CHADS2 values include History of HTN, Age > 75 years old, and Prior Stroke/CVA/TIA.  Negative CHADS2 values include History of CHF and History of Diabetes.  The start date was 06/30/2009.  Plans are to continue warfarin for life.  Anticoagulation responsible Morgan Rennert: Antoine Poche MD, Fayrene Fearing.  INR POC: 2.3.  Cuvette Lot#: [id number].    Anticoagulation Management Assessment/Plan:      The patient's current anticoagulation dose is Warfarin sodium 5 mg tabs: Use as directed by Anticoagulation Clinic.  The target INR is 2.0-3.0.  The next INR is due 01/21/2010.  Anticoagulation instructions were given to patient.  Results were reviewed/authorized by Vashti Hey RN.  He was notified by Vashti Hey RN.        Coagulation management information includes: 12/19/09   amiodarone decreased to 200mg  qd except 100mg  Fri - Sunday.  Prior Anticoagulation  Instructions: INR 1.8 Increase coumadin to 2.5mg  once daily except 5mg  on T,Th,Sat  Current Anticoagulation Instructions: INR 2.3 Continue coumadin 2.5mg  once daily except 5mg  on T,TH,Sat

## 2010-06-10 NOTE — Medication Information (Signed)
Summary: ccr-lr  Anticoagulant Therapy  Managed by: Vashti Hey RN PCP: Celesta Gentile, MD Supervising MD: Andee Lineman MD, Michelle Piper Indication 1: Atrial Fibrillation Indication 2: H/O DVT / TIA Lab Used: [facility] [place] Site: [place] INR POC 2.3  Dietary changes: no    Health status changes: no    Bleeding/hemorrhagic complications: no    Recent/future hospitalizations: no    Any changes in medication regimen? no    Recent/future dental: no  Any missed doses?: no       Is patient compliant with meds? yes       Allergies: 1)  ! * Amien  Anticoagulation Management History:      The patient is taking warfarin and comes in today for a routine follow up visit.  He is being anticoagulated because of chronic atrial fibrillation.  Positive risk factors for bleeding include an age of 75 years or older and history of CVA/TIA.  Negative risk factors for bleeding include no history of GI bleeding and absence of serious comorbidities.  The bleeding index is 'intermediate risk'.  Positive CHADS2 values include History of HTN, Age > 75 years old, and Prior Stroke/CVA/TIA.  Negative CHADS2 values include History of CHF and History of Diabetes.  The start date was 06/30/2009.  Plans are to continue warfarin for life.  Anticoagulation responsible provider: Andee Lineman MD, Michelle Piper.  INR POC: 2.3.  Cuvette Lot#: [id number].    Anticoagulation Management Assessment/Plan:      The patient's current anticoagulation dose is Warfarin sodium 5 mg tabs: Use as directed by Anticoagulation Clinic.  The target INR is 2.0-3.0.  The next INR is due 03/18/2010.  Anticoagulation instructions were given to patient.  Results were reviewed/authorized by Vashti Hey RN.  He was notified by Vashti Hey RN.         Prior Anticoagulation Instructions: INR 2.2 Continue coumadin 2.5mg  once daily except 5mg  on T,Th,Sat  Current Anticoagulation Instructions: INR 2.3 Continue coumadin 2.5mg  once daily except 5mg  on T,Th,Sat

## 2010-06-10 NOTE — Medication Information (Signed)
Summary: ccr-lr  Anticoagulant Therapy  Managed by: Vashti Hey RN PCP: Celesta Gentile, MD Supervising MD: Andee Lineman MD, Michelle Piper Indication 1: Atrial Fibrillation Indication 2: H/O DVT / TIA Lab Used: [facility] [place] Site: [place] INR POC 2.2  Dietary changes: no    Health status changes: no    Bleeding/hemorrhagic complications: no    Recent/future hospitalizations: no    Any changes in medication regimen? no    Recent/future dental: no  Any missed doses?: no       Is patient compliant with meds? yes       Allergies: 1)  ! * Amien  Anticoagulation Management History:      The patient is taking warfarin and comes in today for a routine follow up visit.  He is being anticoagulated due to chronic atrial fibrillation.  Positive risk factors for bleeding include an age of 75 years or older and history of CVA/TIA.  Negative risk factors for bleeding include no history of GI bleeding and absence of serious comorbidities.  The bleeding index is 'intermediate risk'.  Positive CHADS2 values include History of HTN, Age > 75 years old, and Prior Stroke/CVA/TIA.  Negative CHADS2 values include History of CHF and History of Diabetes.  The start date was 06/30/2009.  Plans are to continue warfarin for life.  Anticoagulation responsible Orvill Coulthard: Andee Lineman MD, Michelle Piper.  INR POC: 2.2.  Cuvette Lot#: [id number].    Anticoagulation Management Assessment/Plan:      The patient's current anticoagulation dose is Warfarin sodium 5 mg tabs: Use as directed by Anticoagulation Clinic.  The target INR is 2.0-3.0.  The next INR is due 02/18/2010.  Anticoagulation instructions were given to patient.  Results were reviewed/authorized by Vashti Hey RN.  He was notified by Vashti Hey RN.         Prior Anticoagulation Instructions: INR 2.3 Continue coumadin 2.5mg  once daily except 5mg  on T,TH,Sat  Current Anticoagulation Instructions: INR 2.2 Continue coumadin 2.5mg  once daily except 5mg  on T,Th,Sat

## 2010-06-10 NOTE — Miscellaneous (Signed)
Summary: [facility] Cardiac Progress Report   [facility] Cardiac Progress Report   Imported By: Roderic Ovens 11/15/2009 11:23:49  _____________________________________________________________________  External Attachment:    Type:   Image     Comment:   External Document

## 2010-06-10 NOTE — Progress Notes (Signed)
Summary: med question  Phone Note Other Incoming   Caller: Amanda/[facility] Summary of Call: Med question about amiodarone and simvastatin, ref #[phone] Initial call taken by: Migdalia Dk,  July 19, 2009 10:37 AM  Follow-up for Phone Call        [phone] spoke with PharmD and ok'd two medications .sign

## 2010-06-10 NOTE — Medication Information (Signed)
Summary: ccr-lr  Anticoagulant Therapy  Managed by: Vashti Hey RN PCP: Celesta Gentile, MD Supervising MD: Diona Browner MD, Remi Deter Indication 1: Atrial Fibrillation Indication 2: H/O DVT / TIA Lab Used: [facility] [place] Site: [place] INR POC 2.3  Dietary changes: no    Health status changes: no    Bleeding/hemorrhagic complications: no    Recent/future hospitalizations: no    Any changes in medication regimen? no    Recent/future dental: no  Any missed doses?: no       Is patient compliant with meds? yes       Allergies: 1)  ! * Amien  Anticoagulation Management History:      The patient is taking warfarin and comes in today for a routine follow up visit.  The patient is taking warfarin for chronic atrial fibrillation.  Positive risk factors for bleeding include an age of 75 years or older and history of CVA/TIA.  Negative risk factors for bleeding include no history of GI bleeding and absence of serious comorbidities.  The bleeding index is 'intermediate risk'.  Positive CHADS2 values include History of HTN, Age > 79 years old, and Prior Stroke/CVA/TIA.  Negative CHADS2 values include History of CHF and History of Diabetes.  The start date was 06/30/2009.  Plans are to continue warfarin for life.  Anticoagulation responsible provider: Diona Browner MD, Remi Deter.  INR POC: 2.3.  Cuvette Lot#: [id number].    Anticoagulation Management Assessment/Plan:      The patient's current anticoagulation dose is Warfarin sodium 5 mg tabs: Use as directed by Anticoagulation Clinic.  The target INR is 2.0-3.0.  The next INR is due 10/04/2009.  Anticoagulation instructions were given to patient.  Results were reviewed/authorized by Vashti Hey RN.  He was notified by Vashti Hey RN.         Prior Anticoagulation Instructions: INR 1.7 Take coumadin 7.5mg  tonight then increase coumadin to 2.5mg  once daily except 5mg  on Tuesdays and Fridays  Current Anticoagulation Instructions: INR  2.3 Continue coumadin 2.5mg  once daily except 5mg  on Tuesdays and Fridays

## 2010-06-10 NOTE — Medication Information (Signed)
Summary: ccr-lr  Anticoagulant Therapy  Managed by: Vashti Hey RN PCP: Celesta Gentile, MD Supervising MD: Andee Lineman MD, Michelle Piper Indication 1: Atrial Fibrillation Indication 2: H/O DVT / TIA Lab Used: [facility] [place] Site: [place] INR POC 2.2  Dietary changes: no    Health status changes: no    Bleeding/hemorrhagic complications: no    Recent/future hospitalizations: no    Any changes in medication regimen? no    Recent/future dental: no  Any missed doses?: no       Is patient compliant with meds? yes       Allergies: 1)  ! * Amien  Anticoagulation Management History:      The patient is taking warfarin and comes in today for a routine follow up visit.  The patient is on warfarin for chronic atrial fibrillation.  Positive risk factors for bleeding include an age of 75 years or older and history of CVA/TIA.  Negative risk factors for bleeding include no history of GI bleeding and absence of serious comorbidities.  The bleeding index is 'intermediate risk'.  Positive CHADS2 values include History of HTN, Age > 75 years old, and Prior Stroke/CVA/TIA.  Negative CHADS2 values include History of CHF and History of Diabetes.  The start date was 06/30/2009.  Plans are to continue warfarin for life.  Anticoagulation responsible provider: Andee Lineman MD, Michelle Piper.  INR POC: 2.2.  Cuvette Lot#: [id number].    Anticoagulation Management Assessment/Plan:      The patient's current anticoagulation dose is Warfarin sodium 5 mg tabs: Use as directed by Anticoagulation Clinic.  The target INR is 2.0-3.0.  The next INR is due 12/02/2009.  Anticoagulation instructions were given to patient.  Results were reviewed/authorized by Vashti Hey RN.  He was notified by Vashti Hey RN.         Prior Anticoagulation Instructions: INR 2.0 Take coumadin 7.5mg  tonight then resume 2.5mg  once daily except 5mg  on Tuesdays and Fridays  Current Anticoagulation Instructions: INR 2.2 Continue coumadin 2.5mg  once daily  except 5mg  on Tuesdays and Fridays

## 2010-06-10 NOTE — Medication Information (Signed)
Summary: ccr-lr  Anticoagulant Therapy  Managed by: Vashti Hey RN PCP: Celesta Gentile, MD Supervising MD: Antoine Poche MD, Fayrene Fearing Indication 1: Atrial Fibrillation Indication 2: H/O DVT / TIA Lab Used: [facility] [place] Site: [place] INR POC 1.8  Dietary changes: no    Health status changes: no    Bleeding/hemorrhagic complications: no    Recent/future hospitalizations: no    Any changes in medication regimen? no    Recent/future dental: no  Any missed doses?: no       Is patient compliant with meds? yes       Allergies: 1)  ! * Amien  Anticoagulation Management History:      The patient is taking warfarin and comes in today for a routine follow up visit.  Warfarin therapy is being given due to chronic atrial fibrillation.  Positive risk factors for bleeding include an age of 15 years or older and history of CVA/TIA.  Negative risk factors for bleeding include no history of GI bleeding and absence of serious comorbidities.  The bleeding index is 'intermediate risk'.  Positive CHADS2 values include History of HTN, Age > 26 years old, and Prior Stroke/CVA/TIA.  Negative CHADS2 values include History of CHF and History of Diabetes.  The start date was 06/30/2009.  Plans are to continue warfarin for life.  Anticoagulation responsible provider: Antoine Poche MD, Fayrene Fearing.  INR POC: 1.8.  Cuvette Lot#: [id number].    Anticoagulation Management Assessment/Plan:      The patient's current anticoagulation dose is Warfarin sodium 5 mg tabs: Use as directed by Anticoagulation Clinic.  The target INR is 2.0-3.0.  The next INR is due 05/13/2010.  Anticoagulation instructions were given to patient.  Results were reviewed/authorized by Vashti Hey RN.  He was notified by Vashti Hey RN.         Prior Anticoagulation Instructions: INR 1.8 Take coumadin 1 tablet tonight then resume 1/2 tablet once daily except 1 tablet on T,Th,Sat  Current Anticoagulation Instructions: INR 1.8 Take coumadin 1  1/2 tablets tonight then increase dose to 1 tablet once daily except 1/2 tablet on M,W,F

## 2010-06-10 NOTE — Miscellaneous (Signed)
Summary: [facility] Cardiac Progress Report  [facility] Cardiac Progress Report   Imported By: Roderic Ovens 10/30/2009 15:59:03  _____________________________________________________________________  External Attachment:    Type:   Image     Comment:   External Document

## 2010-06-10 NOTE — Assessment & Plan Note (Signed)
Summary: rov dizzy spells   Visit Type:  Follow-up Primary Danny Lucas:  Danny Gentile, MD  CC:  having some dizzinessstarted after adding new med.  History of Present Illness: [name] is a pleasant 77 yo man from [place], [place] who has a h/o CAD and atrial fibrillation who underwent CABG/MAZE over a year ago.  He has been stable but does note some occaisional dizziness.   He has not had any syncope.  No other complaints.  Current Medications (verified): 1)  Amiodarone Hcl 200 Mg Tabs (Amiodarone Hcl) .... Take One Tablet By Mouth Mon.-Thurs. and 1/2 Tablet Fri- Sun 2)  Aspirin 81 Mg Tbec (Aspirin) .... Take One Tablet By Mouth Daily 3)  Warfarin Sodium 5 Mg Tabs (Warfarin Sodium) .... Use As Directed By Anticoagulation Clinic 4)  Folic Acid 1 Mg Tabs (Folic Acid) .... Take One Tablet By Mouth Once Daily. 5)  Cinnamon 1000 Mg Caps (Cinnamon) .... Take One Tablet By Mouth Once Daily. 6)  Doxazosin Mesylate 4 Mg Tabs (Doxazosin Mesylate) .... Take One Tablet By Mouth Once Daily. 7)  Gabapentin 300 Mg Caps (Gabapentin) .... 2 Tabs Once Daily 8)  Metoprolol Tartrate 50 Mg Tabs (Metoprolol Tartrate) .... Take One Tablet By Mouth Twice A Day  Allergies (verified): 1)  ! Carmela Hurt  Past History:  Past Medical History: Last updated: 07/05/2009 Current Problems:  CAROTID ARTERY STENOSIS (ICD-433.10) TRANSIENT ISCHEMIC ATTACK (ICD-435.9) HYPERTENSION (ICD-401.9) MI (ICD-410.90)    Past Surgical History: Last updated: 07/05/2009  DATE OF PROCEDURE:  06/24/2009  PHYSICIAN:  Sheliah Plane, MD   SURGICAL PROCEDURES:  Coronary artery bypass grafting x6 with the left   internal mammary to the left anterior descending coronary artery,   reverse saphenous vein graft to the intermediate coronary artery,   sequential reverse saphenous vein graft to the first obtuse marginal and   distal circumflex, sequential reverse saphenous vein graft to the acute   marginal and posterior descending  coronary artery branch of the right   coronary artery with left-sided Maze and stapling, obliteration of the   left atrial appendage and placement of right femoral arterial line.      Review of Systems  The patient denies chest pain, syncope, dyspnea on exertion, and peripheral edema.    Vital Signs:  Patient profile:   77 year old male Weight:      170 pounds Pulse rate:   57 / minute BP sitting:   120 / 69  (right arm)  Vitals Entered By: Dreama Saa, CNA (December 19, 2009 8:19 AM)  Physical Exam  General:  Elderly, well developed, well nourished, in no acute distress.  HEENT: normal Neck: supple. No JVD. Carotids 2+ bilaterally no bruits Cor: RRR no rubs, gallops or murmur. Well healed sternotomy incision. Lungs: CTA with well healed PPM incision. Ab: soft, nontender. nondistended. No HSM. Good bowel sounds Ext: warm. no cyanosis or clubbing. Trace edema Neuro: alert and oriented. Grossly nonfocal. affect pleasant    Impression & Recommendations:  Problem # 1:  ATRIAL FIBRILLATION (ICD-427.31) He appears to be maintaining NSR.  I have asked him to reduce his dose of amiodarone as below. His updated medication list for this problem includes:    Amiodarone Hcl 200 Mg Tabs (Amiodarone hcl) .Marland Kitchen... Take one tablet by mouth mon.-thurs. and 1/2 tablet fri- sun    Aspirin 81 Mg Tbec (Aspirin) .Marland Kitchen... Take one tablet by mouth daily    Warfarin Sodium 5 Mg Tabs (Warfarin sodium) ..... Use as directed by  anticoagulation clinic    Metoprolol Tartrate 50 Mg Tabs (Metoprolol tartrate) .Marland Kitchen... Take one tablet by mouth twice a day  Problem # 2:  CAD (ICD-414.00) He denies any anginal symptoms.  I have asked him to stop taking his simvastatin secondary to dizziness and fatigue. His updated medication list for this problem includes:    Aspirin 81 Mg Tbec (Aspirin) .Marland Kitchen... Take one tablet by mouth daily    Warfarin Sodium 5 Mg Tabs (Warfarin sodium) ..... Use as directed by anticoagulation  clinic    Metoprolol Tartrate 50 Mg Tabs (Metoprolol tartrate) .Marland Kitchen... Take one tablet by mouth twice a day  Problem # 3:  HYPERTENSION (ICD-401.9) His blood pressure appears to be well controlled.  Continue meds as below and a low sodium diet. His updated medication list for this problem includes:    Aspirin 81 Mg Tbec (Aspirin) .Marland Kitchen... Take one tablet by mouth daily    Doxazosin Mesylate 4 Mg Tabs (Doxazosin mesylate) .Marland Kitchen... Take one tablet by mouth once daily.    Metoprolol Tartrate 50 Mg Tabs (Metoprolol tartrate) .Marland Kitchen... Take one tablet by mouth twice a day  Patient Instructions: 1)  Your physician recommends that you schedule a follow-up appointment in: 1 year 2)  Your physician has recommended you make the following change in your medication: Stop taking Simvastatin and decrease Amiodarone to 1 whole tablet Monday through Thursday and 1/2 tablet Friday through Sunday.

## 2010-06-12 NOTE — Medication Information (Signed)
Summary: ccr-lr  Anticoagulant Therapy  Managed by: Vashti Hey RN PCP: Celesta Gentile, MD Supervising MD: Diona Browner MD, Remi Deter Indication 1: Atrial Fibrillation Indication 2: H/O DVT / TIA Lab Used: [facility] [place] Site: [place] INR POC 2.1  Dietary changes: no    Health status changes: no    Bleeding/hemorrhagic complications: no    Recent/future hospitalizations: no    Any changes in medication regimen? no    Recent/future dental: no  Any missed doses?: no       Is patient compliant with meds? yes       Allergies: 1)  ! * Amien  Anticoagulation Management History:      The patient is taking warfarin and comes in today for a routine follow up visit.  Warfarin therapy is being given due to chronic atrial fibrillation.  Positive risk factors for bleeding include an age of 75 years or older and history of CVA/TIA.  Negative risk factors for bleeding include no history of GI bleeding and absence of serious comorbidities.  The bleeding index is 'intermediate risk'.  Positive CHADS2 values include History of HTN, Age > 11 years old, and Prior Stroke/CVA/TIA.  Negative CHADS2 values include History of CHF and History of Diabetes.  The start date was 06/30/2009.  Plans are to continue warfarin for life.  Anticoagulation responsible provider: Diona Browner MD, Remi Deter.  INR POC: 2.1.  Cuvette Lot#: [id number].    Anticoagulation Management Assessment/Plan:      The patient's current anticoagulation dose is Warfarin sodium 5 mg tabs: Use as directed by Anticoagulation Clinic.  The target INR is 2.0-3.0.  The next INR is due 06/10/2010.  Anticoagulation instructions were given to patient.  Results were reviewed/authorized by Vashti Hey RN.  He was notified by Vashti Hey RN.         Prior Anticoagulation Instructions: INR 1.8 Take coumadin 1 1/2 tablets tonight then increase dose to 1 tablet once daily except 1/2 tablet on M,W,F  Current Anticoagulation Instructions: INR  2.1 Continue coumadin 5mg  once daily except 2.5mg  on M,W,F

## 2010-06-18 NOTE — Medication Information (Signed)
Summary: ccr-lr  Anticoagulant Therapy  Managed by: Vashti Hey RN PCP: Celesta Gentile, MD Supervising MD: Andee Lineman MD, Michelle Piper Indication 1: Atrial Fibrillation Indication 2: H/O DVT / TIA Lab Used: [facility] [place] Site: [place] INR POC 2.2  Dietary changes: no    Health status changes: no    Bleeding/hemorrhagic complications: no    Recent/future hospitalizations: no    Any changes in medication regimen? no    Recent/future dental: no  Any missed doses?: no       Is patient compliant with meds? yes       Allergies: 1)  ! * Amien  Anticoagulation Management History:      The patient is taking warfarin and comes in today for a routine follow up visit.  Anticoagulation is being administered due to chronic atrial fibrillation.  Positive risk factors for bleeding include an age of 33 years or older and history of CVA/TIA.  Negative risk factors for bleeding include no history of GI bleeding and absence of serious comorbidities.  The bleeding index is 'intermediate risk'.  Positive CHADS2 values include History of HTN, Age > 95 years old, and Prior Stroke/CVA/TIA.  Negative CHADS2 values include History of CHF and History of Diabetes.  The start date was 06/30/2009.  Plans are to continue warfarin for life.  Anticoagulation responsible provider: Andee Lineman MD, Michelle Piper.  INR POC: 2.2.  Cuvette Lot#: [id number].    Anticoagulation Management Assessment/Plan:      The patient's current anticoagulation dose is Warfarin sodium 5 mg tabs: Use as directed by Anticoagulation Clinic.  The target INR is 2.0-3.0.  The next INR is due 07/08/2010.  Anticoagulation instructions were given to patient.  Results were reviewed/authorized by Vashti Hey RN.  He was notified by Vashti Hey RN.         Prior Anticoagulation Instructions: INR 2.1 Continue coumadin 5mg  once daily except 2.5mg  on M,W,F  Current Anticoagulation Instructions: INR 2.2 Continue coumadin 5mg  once daily except 2.5mg  on M,W,F

## 2010-07-08 ENCOUNTER — Encounter (INDEPENDENT_AMBULATORY_CARE_PROVIDER_SITE_OTHER): Payer: Medicare FFS

## 2010-07-08 ENCOUNTER — Encounter: Payer: Self-pay | Admitting: Cardiology

## 2010-07-08 DIAGNOSIS — Z7901 Long term (current) use of anticoagulants: Secondary | ICD-10-CM

## 2010-07-08 DIAGNOSIS — I4891 Unspecified atrial fibrillation: Secondary | ICD-10-CM

## 2010-07-08 DIAGNOSIS — G450 Vertebro-basilar artery syndrome: Secondary | ICD-10-CM

## 2010-07-17 NOTE — Medication Information (Signed)
Summary: ccr-lr  Anticoagulant Therapy  Managed by: Vashti Hey RN PCP: Celesta Gentile, MD Supervising MD: Andee Lineman MD, Michelle Piper Indication 1: Atrial Fibrillation Indication 2: H/O DVT / TIA Lab Used: [facility] [place] Site: [place] INR POC 2.5  Dietary changes: no    Health status changes: no    Bleeding/hemorrhagic complications: no    Recent/future hospitalizations: no    Any changes in medication regimen? no    Recent/future dental: no  Any missed doses?: no       Is patient compliant with meds? yes       Allergies: 1)  ! * Amien  Anticoagulation Management History:      The patient is taking warfarin and comes in today for a routine follow up visit.  Anticoagulation is being administered due to chronic atrial fibrillation.  Positive risk factors for bleeding include an age of 75 years or older and history of CVA/TIA.  Negative risk factors for bleeding include no history of GI bleeding and absence of serious comorbidities.  The bleeding index is 'intermediate risk'.  Positive CHADS2 values include History of HTN, Age > 75 years old, and Prior Stroke/CVA/TIA.  Negative CHADS2 values include History of CHF and History of Diabetes.  The start date was 06/30/2009.  Plans are to continue warfarin for life.  Anticoagulation responsible provider: Andee Lineman MD, Michelle Piper.  INR POC: 2.5.  Cuvette Lot#: [id number].    Anticoagulation Management Assessment/Plan:      The patient's current anticoagulation dose is Warfarin sodium 5 mg tabs: Use as directed by Anticoagulation Clinic.  The target INR is 2.0-3.0.  The next INR is due 08/05/2010.  Anticoagulation instructions were given to patient.  Results were reviewed/authorized by Vashti Hey RN.  He was notified by Vashti Hey RN.         Prior Anticoagulation Instructions: INR 2.2 Continue coumadin 5mg  once daily except 2.5mg  on M,W,F  Current Anticoagulation Instructions: INR 2.5 Continue coumadin 5mg  once daily except 2.5mg  on Mondays,  Wednesdays and Fridays

## 2010-07-18 ENCOUNTER — Encounter: Payer: Self-pay | Admitting: *Deleted

## 2010-07-31 LAB — BRAIN NATRIURETIC PEPTIDE: Pro B Natriuretic peptide (BNP): 1397 pg/mL — ABNORMAL HIGH (ref 0.0–100.0)

## 2010-07-31 LAB — CK TOTAL AND CKMB (NOT AT ARMC)
CK, MB: 33 ng/mL (ref 0.3–4.0)
Relative Index: 11 — ABNORMAL HIGH (ref 0.0–2.5)
Total CK: 300 U/L — ABNORMAL HIGH (ref 7–232)

## 2010-07-31 LAB — CARDIAC PANEL(CRET KIN+CKTOT+MB+TROPI)
CK, MB: 37.4 ng/mL (ref 0.3–4.0)
CK, MB: 62.8 ng/mL (ref 0.3–4.0)
Relative Index: 14 — ABNORMAL HIGH (ref 0.0–2.5)
Relative Index: 9.4 — ABNORMAL HIGH (ref 0.0–2.5)
Total CK: 448 U/L — ABNORMAL HIGH (ref 7–232)
Troponin I: 10.56 ng/mL (ref 0.00–0.06)
Troponin I: 12.01 ng/mL (ref 0.00–0.06)

## 2010-07-31 LAB — CBC
HCT: 28.8 % — ABNORMAL LOW (ref 39.0–52.0)
HCT: 28.9 % — ABNORMAL LOW (ref 39.0–52.0)
HCT: 31 % — ABNORMAL LOW (ref 39.0–52.0)
HCT: 31.2 % — ABNORMAL LOW (ref 39.0–52.0)
HCT: 32.2 % — ABNORMAL LOW (ref 39.0–52.0)
HCT: 32.5 % — ABNORMAL LOW (ref 39.0–52.0)
HCT: 32.8 % — ABNORMAL LOW (ref 39.0–52.0)
HCT: 36.3 % — ABNORMAL LOW (ref 39.0–52.0)
HCT: 36.4 % — ABNORMAL LOW (ref 39.0–52.0)
HCT: 37.5 % — ABNORMAL LOW (ref 39.0–52.0)
HCT: 39.4 % (ref 39.0–52.0)
Hemoglobin: 10.7 g/dL — ABNORMAL LOW (ref 13.0–17.0)
Hemoglobin: 10.9 g/dL — ABNORMAL LOW (ref 13.0–17.0)
Hemoglobin: 11.1 g/dL — ABNORMAL LOW (ref 13.0–17.0)
Hemoglobin: 11.2 g/dL — ABNORMAL LOW (ref 13.0–17.0)
Hemoglobin: 11.4 g/dL — ABNORMAL LOW (ref 13.0–17.0)
Hemoglobin: 12.7 g/dL — ABNORMAL LOW (ref 13.0–17.0)
Hemoglobin: 12.8 g/dL — ABNORMAL LOW (ref 13.0–17.0)
Hemoglobin: 9.9 g/dL — ABNORMAL LOW (ref 13.0–17.0)
Hemoglobin: 9.9 g/dL — ABNORMAL LOW (ref 13.0–17.0)
MCHC: 34.3 g/dL (ref 30.0–36.0)
MCHC: 34.3 g/dL (ref 30.0–36.0)
MCHC: 34.5 g/dL (ref 30.0–36.0)
MCHC: 34.5 g/dL (ref 30.0–36.0)
MCHC: 34.7 g/dL (ref 30.0–36.0)
MCHC: 34.7 g/dL (ref 30.0–36.0)
MCHC: 34.8 g/dL (ref 30.0–36.0)
MCHC: 35.3 g/dL (ref 30.0–36.0)
MCV: 89.9 fL (ref 78.0–100.0)
MCV: 90.3 fL (ref 78.0–100.0)
MCV: 90.4 fL (ref 78.0–100.0)
MCV: 90.4 fL (ref 78.0–100.0)
MCV: 90.8 fL (ref 78.0–100.0)
MCV: 90.8 fL (ref 78.0–100.0)
MCV: 90.9 fL (ref 78.0–100.0)
MCV: 91 fL (ref 78.0–100.0)
MCV: 91.1 fL (ref 78.0–100.0)
Platelets: 111 10*3/uL — ABNORMAL LOW (ref 150–400)
Platelets: 114 10*3/uL — ABNORMAL LOW (ref 150–400)
Platelets: 117 10*3/uL — ABNORMAL LOW (ref 150–400)
Platelets: 121 10*3/uL — ABNORMAL LOW (ref 150–400)
Platelets: 152 10*3/uL (ref 150–400)
Platelets: 153 10*3/uL (ref 150–400)
Platelets: 164 10*3/uL (ref 150–400)
Platelets: 195 10*3/uL (ref 150–400)
Platelets: 250 10*3/uL (ref 150–400)
RBC: 3.17 MIL/uL — ABNORMAL LOW (ref 4.22–5.81)
RBC: 3.2 MIL/uL — ABNORMAL LOW (ref 4.22–5.81)
RBC: 3.45 MIL/uL — ABNORMAL LOW (ref 4.22–5.81)
RBC: 3.54 MIL/uL — ABNORMAL LOW (ref 4.22–5.81)
RBC: 3.54 MIL/uL — ABNORMAL LOW (ref 4.22–5.81)
RBC: 3.57 MIL/uL — ABNORMAL LOW (ref 4.22–5.81)
RBC: 3.61 MIL/uL — ABNORMAL LOW (ref 4.22–5.81)
RBC: 3.98 MIL/uL — ABNORMAL LOW (ref 4.22–5.81)
RBC: 4.11 MIL/uL — ABNORMAL LOW (ref 4.22–5.81)
RDW: 13.2 % (ref 11.5–15.5)
RDW: 13.2 % (ref 11.5–15.5)
RDW: 13.4 % (ref 11.5–15.5)
RDW: 13.4 % (ref 11.5–15.5)
RDW: 13.7 % (ref 11.5–15.5)
RDW: 13.7 % (ref 11.5–15.5)
RDW: 13.9 % (ref 11.5–15.5)
RDW: 13.9 % (ref 11.5–15.5)
RDW: 14.5 % (ref 11.5–15.5)
WBC: 10.3 10*3/uL (ref 4.0–10.5)
WBC: 10.7 10*3/uL — ABNORMAL HIGH (ref 4.0–10.5)
WBC: 10.8 10*3/uL — ABNORMAL HIGH (ref 4.0–10.5)
WBC: 12.4 10*3/uL — ABNORMAL HIGH (ref 4.0–10.5)
WBC: 7.6 10*3/uL (ref 4.0–10.5)
WBC: 8.8 10*3/uL (ref 4.0–10.5)
WBC: 8.8 10*3/uL (ref 4.0–10.5)
WBC: 8.9 10*3/uL (ref 4.0–10.5)
WBC: 9.1 10*3/uL (ref 4.0–10.5)

## 2010-07-31 LAB — GLUCOSE, CAPILLARY
Glucose-Capillary: 108 mg/dL — ABNORMAL HIGH (ref 70–99)
Glucose-Capillary: 112 mg/dL — ABNORMAL HIGH (ref 70–99)
Glucose-Capillary: 124 mg/dL — ABNORMAL HIGH (ref 70–99)
Glucose-Capillary: 140 mg/dL — ABNORMAL HIGH (ref 70–99)
Glucose-Capillary: 150 mg/dL — ABNORMAL HIGH (ref 70–99)
Glucose-Capillary: 151 mg/dL — ABNORMAL HIGH (ref 70–99)
Glucose-Capillary: 154 mg/dL — ABNORMAL HIGH (ref 70–99)
Glucose-Capillary: 160 mg/dL — ABNORMAL HIGH (ref 70–99)
Glucose-Capillary: 170 mg/dL — ABNORMAL HIGH (ref 70–99)
Glucose-Capillary: 179 mg/dL — ABNORMAL HIGH (ref 70–99)

## 2010-07-31 LAB — POCT I-STAT 4, (NA,K, GLUC, HGB,HCT)
Glucose, Bld: 112 mg/dL — ABNORMAL HIGH (ref 70–99)
Glucose, Bld: 115 mg/dL — ABNORMAL HIGH (ref 70–99)
Glucose, Bld: 122 mg/dL — ABNORMAL HIGH (ref 70–99)
Glucose, Bld: 135 mg/dL — ABNORMAL HIGH (ref 70–99)
Glucose, Bld: 138 mg/dL — ABNORMAL HIGH (ref 70–99)
Glucose, Bld: 147 mg/dL — ABNORMAL HIGH (ref 70–99)
HCT: 26 % — ABNORMAL LOW (ref 39.0–52.0)
HCT: 26 % — ABNORMAL LOW (ref 39.0–52.0)
HCT: 27 % — ABNORMAL LOW (ref 39.0–52.0)
HCT: 29 % — ABNORMAL LOW (ref 39.0–52.0)
HCT: 34 % — ABNORMAL LOW (ref 39.0–52.0)
HCT: 38 % — ABNORMAL LOW (ref 39.0–52.0)
Hemoglobin: 10.2 g/dL — ABNORMAL LOW (ref 13.0–17.0)
Hemoglobin: 11.6 g/dL — ABNORMAL LOW (ref 13.0–17.0)
Hemoglobin: 12.9 g/dL — ABNORMAL LOW (ref 13.0–17.0)
Hemoglobin: 8.5 g/dL — ABNORMAL LOW (ref 13.0–17.0)
Hemoglobin: 9.2 g/dL — ABNORMAL LOW (ref 13.0–17.0)
Potassium: 4.5 mEq/L (ref 3.5–5.1)
Potassium: 4.6 mEq/L (ref 3.5–5.1)
Potassium: 5.3 mEq/L — ABNORMAL HIGH (ref 3.5–5.1)
Potassium: 5.7 mEq/L — ABNORMAL HIGH (ref 3.5–5.1)
Potassium: 5.9 mEq/L — ABNORMAL HIGH (ref 3.5–5.1)
Sodium: 133 mEq/L — ABNORMAL LOW (ref 135–145)
Sodium: 137 mEq/L (ref 135–145)
Sodium: 138 mEq/L (ref 135–145)
Sodium: 142 mEq/L (ref 135–145)
Sodium: 143 mEq/L (ref 135–145)

## 2010-07-31 LAB — BASIC METABOLIC PANEL
BUN: 16 mg/dL (ref 6–23)
BUN: 18 mg/dL (ref 6–23)
BUN: 19 mg/dL (ref 6–23)
BUN: 22 mg/dL (ref 6–23)
BUN: 8 mg/dL (ref 6–23)
CO2: 21 mEq/L (ref 19–32)
CO2: 25 mEq/L (ref 19–32)
CO2: 26 mEq/L (ref 19–32)
CO2: 27 mEq/L (ref 19–32)
CO2: 28 mEq/L (ref 19–32)
CO2: 32 mEq/L (ref 19–32)
Calcium: 7.5 mg/dL — ABNORMAL LOW (ref 8.4–10.5)
Calcium: 7.9 mg/dL — ABNORMAL LOW (ref 8.4–10.5)
Calcium: 7.9 mg/dL — ABNORMAL LOW (ref 8.4–10.5)
Calcium: 8.2 mg/dL — ABNORMAL LOW (ref 8.4–10.5)
Calcium: 8.3 mg/dL — ABNORMAL LOW (ref 8.4–10.5)
Chloride: 101 mEq/L (ref 96–112)
Chloride: 108 mEq/L (ref 96–112)
Chloride: 109 mEq/L (ref 96–112)
Chloride: 111 mEq/L (ref 96–112)
Chloride: 112 mEq/L (ref 96–112)
Creatinine, Ser: 1.17 mg/dL (ref 0.4–1.5)
Creatinine, Ser: 1.39 mg/dL (ref 0.4–1.5)
Creatinine, Ser: 1.42 mg/dL (ref 0.4–1.5)
Creatinine, Ser: 1.46 mg/dL (ref 0.4–1.5)
Creatinine, Ser: 1.52 mg/dL — ABNORMAL HIGH (ref 0.4–1.5)
GFR calc Af Amer: 52 mL/min — ABNORMAL LOW (ref >60)
GFR calc Af Amer: 54 mL/min — ABNORMAL LOW (ref >60)
GFR calc Af Amer: 57 mL/min — ABNORMAL LOW (ref >60)
GFR calc Af Amer: 58 mL/min — ABNORMAL LOW (ref >60)
GFR calc Af Amer: 60 mL/min — ABNORMAL LOW (ref >60)
GFR calc Af Amer: >60 mL/min (ref >60)
GFR calc non Af Amer: 43 mL/min — ABNORMAL LOW (ref >60)
GFR calc non Af Amer: 45 mL/min — ABNORMAL LOW (ref >60)
GFR calc non Af Amer: 47 mL/min — ABNORMAL LOW (ref >60)
GFR calc non Af Amer: 50 mL/min — ABNORMAL LOW (ref >60)
GFR calc non Af Amer: 51 mL/min — ABNORMAL LOW (ref >60)
Glucose, Bld: 110 mg/dL — ABNORMAL HIGH (ref 70–99)
Glucose, Bld: 112 mg/dL — ABNORMAL HIGH (ref 70–99)
Glucose, Bld: 112 mg/dL — ABNORMAL HIGH (ref 70–99)
Glucose, Bld: 115 mg/dL — ABNORMAL HIGH (ref 70–99)
Glucose, Bld: 165 mg/dL — ABNORMAL HIGH (ref 70–99)
Potassium: 3.1 mEq/L — ABNORMAL LOW (ref 3.5–5.1)
Potassium: 3.8 mEq/L (ref 3.5–5.1)
Potassium: 3.9 mEq/L (ref 3.5–5.1)
Potassium: 4 mEq/L (ref 3.5–5.1)
Potassium: 4.1 mEq/L (ref 3.5–5.1)
Potassium: 4.4 mEq/L (ref 3.5–5.1)
Sodium: 134 mEq/L — ABNORMAL LOW (ref 135–145)
Sodium: 138 mEq/L (ref 135–145)
Sodium: 140 mEq/L (ref 135–145)
Sodium: 142 mEq/L (ref 135–145)
Sodium: 142 mEq/L (ref 135–145)
Sodium: 145 mEq/L (ref 135–145)

## 2010-07-31 LAB — BLOOD GAS, ARTERIAL
Acid-Base Excess: 2.6 mmol/L — ABNORMAL HIGH (ref 0.0–2.0)
Bicarbonate: 26.3 mEq/L — ABNORMAL HIGH (ref 20.0–24.0)
FIO2: 0.21 %
O2 Saturation: 91.5 %
Patient temperature: 98.6
pO2, Arterial: 55.3 mmHg — ABNORMAL LOW (ref 80.0–100.0)

## 2010-07-31 LAB — POCT I-STAT 3, ART BLOOD GAS (G3+)
Acid-Base Excess: 1 mmol/L (ref 0.0–2.0)
Bicarbonate: 19.3 mEq/L — ABNORMAL LOW (ref 20.0–24.0)
Bicarbonate: 23.4 mEq/L (ref 20.0–24.0)
Bicarbonate: 23.8 mEq/L (ref 20.0–24.0)
Bicarbonate: 26.9 mEq/L — ABNORMAL HIGH (ref 20.0–24.0)
O2 Saturation: 100 %
Patient temperature: 35.6
TCO2: 25 mmol/L (ref 0–100)
TCO2: 25 mmol/L (ref 0–100)
TCO2: 26 mmol/L (ref 0–100)
pCO2 arterial: 24.9 mmHg — ABNORMAL LOW (ref 35.0–45.0)
pCO2 arterial: 37.7 mmHg (ref 35.0–45.0)
pCO2 arterial: 40.5 mmHg (ref 35.0–45.0)
pH, Arterial: 7.357 (ref 7.350–7.450)
pH, Arterial: 7.361 (ref 7.350–7.450)
pH, Arterial: 7.37 (ref 7.350–7.450)
pH, Arterial: 7.416 (ref 7.350–7.450)
pH, Arterial: 7.495 — ABNORMAL HIGH (ref 7.350–7.450)
pO2, Arterial: 86 mmHg (ref 80.0–100.0)
pO2, Arterial: 90 mmHg (ref 80.0–100.0)

## 2010-07-31 LAB — URINALYSIS, ROUTINE W REFLEX MICROSCOPIC
Glucose, UA: NEGATIVE mg/dL
Specific Gravity, Urine: 1.024 (ref 1.005–1.030)
pH: 5.5 (ref 5.0–8.0)

## 2010-07-31 LAB — DIFFERENTIAL
Basophils Relative: 0 % (ref 0–1)
Lymphs Abs: 1.3 10*3/uL (ref 0.7–4.0)
Monocytes Relative: 8 % (ref 3–12)
Neutro Abs: 6.8 10*3/uL (ref 1.7–7.7)
Neutrophils Relative %: 75 % (ref 43–77)

## 2010-07-31 LAB — HEPARIN LEVEL (UNFRACTIONATED)
Heparin Unfractionated: 0.16 IU/mL — ABNORMAL LOW (ref 0.30–0.70)
Heparin Unfractionated: 0.4 IU/mL (ref 0.30–0.70)

## 2010-07-31 LAB — PROTIME-INR
INR: 1.18 (ref 0.00–1.49)
INR: 1.23 (ref 0.00–1.49)
INR: 1.36 (ref 0.00–1.49)
INR: 1.36 (ref 0.00–1.49)
INR: 1.54 — ABNORMAL HIGH (ref 0.00–1.49)
INR: 1.86 — ABNORMAL HIGH (ref 0.00–1.49)
Prothrombin Time: 14.9 seconds (ref 11.6–15.2)
Prothrombin Time: 15.4 seconds — ABNORMAL HIGH (ref 11.6–15.2)
Prothrombin Time: 16.7 seconds — ABNORMAL HIGH (ref 11.6–15.2)
Prothrombin Time: 16.7 seconds — ABNORMAL HIGH (ref 11.6–15.2)
Prothrombin Time: 18.4 seconds — ABNORMAL HIGH (ref 11.6–15.2)
Prothrombin Time: 18.4 seconds — ABNORMAL HIGH (ref 11.6–15.2)
Prothrombin Time: 21.3 seconds — ABNORMAL HIGH (ref 11.6–15.2)

## 2010-07-31 LAB — COMPREHENSIVE METABOLIC PANEL
ALT: 46 U/L (ref 0–53)
AST: 56 U/L — ABNORMAL HIGH (ref 0–37)
Alkaline Phosphatase: 55 U/L (ref 39–117)
BUN: 13 mg/dL (ref 6–23)
CO2: 27 mEq/L (ref 19–32)
CO2: 29 mEq/L (ref 19–32)
Calcium: 8.1 mg/dL — ABNORMAL LOW (ref 8.4–10.5)
Calcium: 8.2 mg/dL — ABNORMAL LOW (ref 8.4–10.5)
Creatinine, Ser: 1.06 mg/dL (ref 0.4–1.5)
GFR calc Af Amer: >60 mL/min (ref >60)
GFR calc non Af Amer: 59 mL/min — ABNORMAL LOW (ref >60)
GFR calc non Af Amer: >60 mL/min (ref >60)
Glucose, Bld: 127 mg/dL — ABNORMAL HIGH (ref 70–99)
Total Protein: 5.9 g/dL — ABNORMAL LOW (ref 6.0–8.3)

## 2010-07-31 LAB — URINE MICROSCOPIC-ADD ON

## 2010-07-31 LAB — POCT I-STAT, CHEM 8
BUN: 14 mg/dL (ref 6–23)
BUN: 20 mg/dL (ref 6–23)
Calcium, Ion: 1.19 mmol/L (ref 1.12–1.32)
Chloride: 107 mEq/L (ref 96–112)
Chloride: 110 mEq/L (ref 96–112)
Glucose, Bld: 176 mg/dL — ABNORMAL HIGH (ref 70–99)
Potassium: 4.3 mEq/L (ref 3.5–5.1)
Sodium: 140 mEq/L (ref 135–145)

## 2010-07-31 LAB — POCT I-STAT 3, VENOUS BLOOD GAS (G3P V)
O2 Saturation: 75 %
pCO2, Ven: 40.3 mmHg — ABNORMAL LOW (ref 45.0–50.0)

## 2010-07-31 LAB — HEMOGLOBIN AND HEMATOCRIT, BLOOD: Hemoglobin: 10.6 g/dL — ABNORMAL LOW (ref 13.0–17.0)

## 2010-07-31 LAB — CREATININE, SERUM
Creatinine, Ser: 1.24 mg/dL (ref 0.4–1.5)
Creatinine, Ser: 1.75 mg/dL — ABNORMAL HIGH (ref 0.4–1.5)
GFR calc Af Amer: 46 mL/min — ABNORMAL LOW (ref >60)
GFR calc Af Amer: >60 mL/min (ref >60)
GFR calc non Af Amer: 38 mL/min — ABNORMAL LOW (ref >60)
GFR calc non Af Amer: 57 mL/min — ABNORMAL LOW (ref >60)

## 2010-07-31 LAB — LIPID PANEL
Cholesterol: 130 mg/dL (ref 0–200)
HDL: 35 mg/dL — ABNORMAL LOW (ref >39)
Triglycerides: 63 mg/dL (ref <150)

## 2010-07-31 LAB — TYPE AND SCREEN: Antibody Screen: NEGATIVE

## 2010-07-31 LAB — MAGNESIUM
Magnesium: 2.6 mg/dL — ABNORMAL HIGH (ref 1.5–2.5)
Magnesium: 2.7 mg/dL — ABNORMAL HIGH (ref 1.5–2.5)
Magnesium: 2.8 mg/dL — ABNORMAL HIGH (ref 1.5–2.5)

## 2010-07-31 LAB — PLATELET INHIBITION P2Y12
Platelet Function  P2Y12: 256 [PRU] (ref 194–418)
Platelet Function Baseline: 248 [PRU] (ref 194–418)

## 2010-07-31 LAB — MRSA PCR SCREENING: MRSA by PCR: NEGATIVE

## 2010-07-31 LAB — APTT: aPTT: 41 seconds — ABNORMAL HIGH (ref 24–37)

## 2010-07-31 LAB — ABO/RH: ABO/RH(D): A POS

## 2010-08-02 ENCOUNTER — Encounter: Payer: Self-pay | Admitting: Internal Medicine

## 2010-08-02 DIAGNOSIS — G459 Transient cerebral ischemic attack, unspecified: Secondary | ICD-10-CM

## 2010-08-02 DIAGNOSIS — I4891 Unspecified atrial fibrillation: Secondary | ICD-10-CM

## 2010-08-02 DIAGNOSIS — Z7901 Long term (current) use of anticoagulants: Secondary | ICD-10-CM | POA: Insufficient documentation

## 2010-08-05 ENCOUNTER — Ambulatory Visit (INDEPENDENT_AMBULATORY_CARE_PROVIDER_SITE_OTHER): Payer: Medicare FFS | Admitting: *Deleted

## 2010-08-05 DIAGNOSIS — I4891 Unspecified atrial fibrillation: Secondary | ICD-10-CM

## 2010-08-05 DIAGNOSIS — G459 Transient cerebral ischemic attack, unspecified: Secondary | ICD-10-CM

## 2010-08-05 DIAGNOSIS — Z7901 Long term (current) use of anticoagulants: Secondary | ICD-10-CM

## 2010-08-05 LAB — POCT INR: INR: 2.5

## 2010-08-26 ENCOUNTER — Encounter: Payer: Self-pay | Admitting: Internal Medicine

## 2010-08-26 ENCOUNTER — Ambulatory Visit (INDEPENDENT_AMBULATORY_CARE_PROVIDER_SITE_OTHER): Payer: Medicare FFS | Admitting: Internal Medicine

## 2010-08-26 DIAGNOSIS — I2510 Atherosclerotic heart disease of native coronary artery without angina pectoris: Secondary | ICD-10-CM

## 2010-08-26 DIAGNOSIS — I4891 Unspecified atrial fibrillation: Secondary | ICD-10-CM

## 2010-08-26 DIAGNOSIS — I10 Essential (primary) hypertension: Secondary | ICD-10-CM

## 2010-08-26 NOTE — Assessment & Plan Note (Signed)
He denies anginal symptoms. Will continue his current meds and maintain a low sodium diet.

## 2010-08-26 NOTE — Assessment & Plan Note (Signed)
He has maintained NSR very nicely but his dizzy spells are concerning. I suspect they may be related to his amiodarone intake and have asked that he stop this medication. Will consider additional medication for his atrial fib if he develops more palpitations.

## 2010-08-26 NOTE — Progress Notes (Signed)
HPI Danny Lucas returns today for followup. He is a pleasant 77 yo man with a h/o atrial fib on amiodarone, HTN, and on chronic coumadin. He has had worsening dizziness and has actually fallen a couple of times. He has not injured himself. He notes that he gets weak easily. No frank syncope or palpitations. He has had a CT scan of the brain which he reports was unremarkable.  No Known Allergies   Current Outpatient Prescriptions  Medication Sig Dispense Refill  . amiodarone (PACERONE) 200 MG tablet Take 200 mg by mouth daily. Monday thru Thursday 1/2 tab fri thru sun       . aspirin 81 MG tablet Take 81 mg by mouth daily.        . Cinnamon 500 MG TABS Take 2 tablets by mouth daily.        Marland Kitchen doxazosin (CARDURA) 4 MG tablet Take 4 mg by mouth at bedtime.        . finasteride (PROSCAR) 5 MG tablet Take 5 mg by mouth daily.       Marland Kitchen gabapentin (NEURONTIN) 300 MG capsule Take 300 mg by mouth 3 (three) times daily.       . metoprolol (LOPRESSOR) 50 MG tablet Take 50 mg by mouth 2 (two) times daily.        Marland Kitchen warfarin (COUMADIN) 5 MG tablet Take 5 mg by mouth daily.        Marland Kitchen DISCONTD: folic acid (FOLVITE) 1 MG tablet Take 1 mg by mouth daily.           Past Medical History  Diagnosis Date  . Carotid artery occlusion     carotid artery stenosis  . Transient ischemic attack   . Hypertension   . Myocardial infarction     ROS:   All systems reviewed and negative except as noted in the HPI.   Past Surgical History  Procedure Date  . Coronary artery bypass graft     coronary artery bypass grafting time 6 with left internal mammery to ythe left anterior descending coronary artery ,reverse saphenous vein graft to the intermediate coronary artery sequential reverse saphenous vein graft to the first obtuse marginal and distal circumflex,sequential reverse saphenous vein graft to the acute  marginal and posterior descending coronary artery branch of the right corh     No family history on  file.   History   Social History  . Marital Status: Married    Spouse Name: N/A    Number of Children: N/A  . Years of Education: N/A   Occupational History  . Not on file.   Social History Main Topics  . Smoking status: Never Smoker   . Smokeless tobacco: Never Used  . Alcohol Use: No  . Drug Use: No  . Sexually Active: Not on file   Other Topics Concern  . Not on file   Social History Narrative  . No narrative on file     BP 120/69  Pulse 57  Ht 5\' 9"  (1.753 m)  Wt 178 lb (80.74 kg)  BMI 26.29 kg/m2  Physical Exam:  Well appearing NAD HEENT: Unremarkable Neck:  No JVD, no thyromegally Lymphatics:  No adenopathy Back:  No CVA tenderness Lungs:  Clear HEART:  Regular rate rhythm, no murmurs, no rubs, no clicks Abd:  Flat, positive bowel sounds, no organomegally, no rebound, no guarding Ext:  2 plus pulses, no edema, no cyanosis, no clubbing Skin:  No rashes no nodules Neuro:  CN II  through XII intact, motor grossly intact  EKG  DEVICE  Normal device function.  See PaceArt for details.   Assess/Plan:

## 2010-08-26 NOTE — Patient Instructions (Signed)
**Note De-Identified  Obfuscation** Your physician has recommended you make the following change in your medication: Stop taking Amiodarone  Your physician recommends that you schedule a follow-up appointment in: 3 months

## 2010-08-26 NOTE — Assessment & Plan Note (Signed)
His blood pressure is well controlled today. Will follow and I have asked him to maintain a low sodium diet.

## 2010-09-02 ENCOUNTER — Ambulatory Visit (INDEPENDENT_AMBULATORY_CARE_PROVIDER_SITE_OTHER): Payer: Medicare FFS | Admitting: *Deleted

## 2010-09-02 DIAGNOSIS — Z7901 Long term (current) use of anticoagulants: Secondary | ICD-10-CM

## 2010-09-02 DIAGNOSIS — I4891 Unspecified atrial fibrillation: Secondary | ICD-10-CM

## 2010-09-02 DIAGNOSIS — G459 Transient cerebral ischemic attack, unspecified: Secondary | ICD-10-CM

## 2010-09-02 LAB — POCT INR: INR: 1.9

## 2010-09-19 ENCOUNTER — Ambulatory Visit (INDEPENDENT_AMBULATORY_CARE_PROVIDER_SITE_OTHER): Payer: Medicare FFS | Admitting: *Deleted

## 2010-09-19 DIAGNOSIS — G459 Transient cerebral ischemic attack, unspecified: Secondary | ICD-10-CM

## 2010-09-19 DIAGNOSIS — I4891 Unspecified atrial fibrillation: Secondary | ICD-10-CM

## 2010-09-19 DIAGNOSIS — Z7901 Long term (current) use of anticoagulants: Secondary | ICD-10-CM

## 2010-09-19 LAB — POCT INR: INR: 1.9

## 2010-09-23 NOTE — Assessment & Plan Note (Signed)
OFFICE VISIT   NICOLAOS, MITRANO  DOB:  August 08, 1929                                        July 25, 2009  CHART #:  [id number]   HISTORY:  The patient returns to the office today after his recent  coronary artery bypass grafting done on June 24, 2009.  At that  time, he presented with atrial fibrillation, severe LV dysfunction,  acute myocardial infarction, and coronary occlusive disease.  He  underwent coronary artery bypass grafting x6 to the left anterior  descending coronary artery, reverse saphenous vein graft to  intermediate, reverse saphenous vein graft to the first obtuse and  distal circumflex, and sequential reverse saphenous vein graft to the  acute marginal and posterior descending with a maze and obliteration of  the left atrial appendage.  Considering his significant LV dysfunction,  the patient now is making very good progress.  He notes that he recently  saw Dr. Ladona Ridgel, was noted to be in sinus rhythm.  He has not had overt  symptoms of congestive heart failure.  Since discharge, he is increasing  his physical activity appropriately.  Prior to surgery, he had been just  started on metformin.  He notes that since discharge his sugars have  been running around 100 and he has not started the metformin.   PHYSICAL EXAMINATION:  His blood pressure is 126/73, pulse is 63 and  regular, respiratory rate is 16, and O2 sats 96%.  His sternum is stable  and well healed.  He has slight blunting of the left costophrenic angle.  Clear lung fields on the right.  He has no pedal edema.  Endoscopic vein  harvest sites are healing well.   Chest x-ray shows blunting of the left costophrenic angle.  Otherwise,  clear lung fields.   MEDICATIONS:  He now is on amiodarone 200 mg a day, this has recently  been decreased; aspirin 81 mg a day; simvastatin 20 mg a day; Coumadin  as directed by Dr. Lubertha Basque office; doxazosin 4 mg a day, Neurontin 300  twice a day,  and metoprolol 50 twice a day.   IMPRESSION:  Overall, the patient is making excellent progress.  I am  very pleased with how he is doing especially with what appears to be  improvement in his LV function and maintaining a sinus rhythm.  I  suspect that Dr. Ladona Ridgel over the next several months will continue to  decrease his amiodarone and discontinue his Coumadin.  Since he lives in  [place], I have not made a return appointment for him to see me as  he is followed in the [facility] office, but would be glad to see him  at his or Dr. Lubertha Basque request at anytime.   Sheliah Plane, MD  Electronically Signed   EG/MEDQ  D:  07/25/2009  T:  07/26/2009  Job:  [id number]   cc:   Doylene Canning. Ladona Ridgel, MD  Juliette Alcide, MD

## 2010-09-26 NOTE — Op Note (Signed)
NAME:  Danny Lucas, Danny Lucas                           ACCOUNT NO.:  [id number]   MEDICAL RECORD NO.:  [id number]                   PATIENT TYPE:  AMB   LOCATION:  DAY                                  FACILITY:  [facility]   PHYSICIAN:  Lionel December, M.D.                 DATE OF BIRTH:  09-Jan-1930   DATE OF PROCEDURE:  03/12/2003  DATE OF DISCHARGE:                                 OPERATIVE REPORT   PROCEDURE:  Total colonoscopy.   INDICATIONS:  [name] is a 77 year old Caucasian male who has had two adenomas  removed about four years ago and one had high-grade dysplasia and/or CIS.  He is now returning for surveillance colonoscopy.  He does not have any GI  symptoms.  Procedure was reviewed with the patient and informed consent was  obtained.   PREOPERATIVE MEDICATIONS:  Demerol 25 mg IV, Versed 3 mg IV.   FINDINGS:  The procedure was performed in the endoscopy suite.  The  patient's vital signs and O2 saturation were monitored during the procedure  and remained stable.  The patient was placed in the left lateral recumbent  position.  Rectal examination was performed.  No abnormality noted on  external or digital exam.  Olympus videoscope was placed in the rectum and  advanced under vision into the sigmoid colon and beyond.  Preparation was  excellent.  A few small scattered diverticula were noted at the sigmoid  colon.  The scope was passed to the cecum which was identified by the  appendiceal orifice and ileocecal valve.  Pictures taken for the record.  There were two small polyps.  One on either side of the appendiceal orifice.  These were easily ablated via cold biopsy.  The rest of the colonic mucosa  was once again carefully examined.  There was another small polyp at rectum  which was ablated via cold biopsy.  Anorectal junction was unremarkable.  Endoscope was straightened and withdrawn.  The patient tolerated the  procedure well.   FINAL DIAGNOSIS:  1. Examination performed to the  cecum.  2. Two small polyps at cecum that were obliterated by cold biopsy.  3. Small polyp was at rectum was freed in similar fashion.  4. A few diverticula at sigmoid colon.   RECOMMENDATIONS:  1. High-fiber diet.  2. Citrucel one tablespoonful daily.  3.     He will resume his Plavix today.  4. I will be contacting the patient with the biopsy results.  5. I feel he can wait five years before his next colonoscopy.      ___________________________________________                                            Lionel December, M.D.   NR/MEDQ  D:  03/12/2003  T:  03/12/2003  Job:  [id number]   cc:   Linward Foster  [address]  [place]  [place] [address]  Fax: [phone]

## 2010-10-07 ENCOUNTER — Ambulatory Visit (INDEPENDENT_AMBULATORY_CARE_PROVIDER_SITE_OTHER): Payer: Medicare FFS | Admitting: *Deleted

## 2010-10-07 DIAGNOSIS — Z7901 Long term (current) use of anticoagulants: Secondary | ICD-10-CM

## 2010-10-07 DIAGNOSIS — I4891 Unspecified atrial fibrillation: Secondary | ICD-10-CM

## 2010-10-07 DIAGNOSIS — G459 Transient cerebral ischemic attack, unspecified: Secondary | ICD-10-CM

## 2010-10-07 LAB — POCT INR: INR: 1.7

## 2010-10-28 ENCOUNTER — Ambulatory Visit (INDEPENDENT_AMBULATORY_CARE_PROVIDER_SITE_OTHER): Payer: Medicare FFS | Admitting: *Deleted

## 2010-10-28 DIAGNOSIS — Z7901 Long term (current) use of anticoagulants: Secondary | ICD-10-CM

## 2010-10-28 DIAGNOSIS — I4891 Unspecified atrial fibrillation: Secondary | ICD-10-CM

## 2010-10-28 DIAGNOSIS — G459 Transient cerebral ischemic attack, unspecified: Secondary | ICD-10-CM

## 2010-10-28 LAB — POCT INR: INR: 1.8

## 2010-10-28 MED ORDER — WARFARIN SODIUM 5 MG PO TABS: 5.0000 mg | ORAL_TABLET | Freq: Every day | ORAL | Status: DC

## 2010-11-18 ENCOUNTER — Ambulatory Visit (INDEPENDENT_AMBULATORY_CARE_PROVIDER_SITE_OTHER): Payer: Medicare FFS | Admitting: *Deleted

## 2010-11-18 DIAGNOSIS — G459 Transient cerebral ischemic attack, unspecified: Secondary | ICD-10-CM

## 2010-11-18 DIAGNOSIS — I4891 Unspecified atrial fibrillation: Secondary | ICD-10-CM

## 2010-11-18 DIAGNOSIS — Z7901 Long term (current) use of anticoagulants: Secondary | ICD-10-CM

## 2010-12-16 ENCOUNTER — Ambulatory Visit (INDEPENDENT_AMBULATORY_CARE_PROVIDER_SITE_OTHER): Payer: Medicare FFS | Admitting: *Deleted

## 2010-12-16 DIAGNOSIS — I4891 Unspecified atrial fibrillation: Secondary | ICD-10-CM

## 2010-12-16 DIAGNOSIS — Z7901 Long term (current) use of anticoagulants: Secondary | ICD-10-CM

## 2010-12-16 DIAGNOSIS — G459 Transient cerebral ischemic attack, unspecified: Secondary | ICD-10-CM

## 2011-01-09 ENCOUNTER — Encounter: Payer: Self-pay | Admitting: Internal Medicine

## 2011-01-13 ENCOUNTER — Encounter: Payer: Self-pay | Admitting: Internal Medicine

## 2011-01-13 ENCOUNTER — Ambulatory Visit (INDEPENDENT_AMBULATORY_CARE_PROVIDER_SITE_OTHER): Payer: Medicare FFS | Admitting: *Deleted

## 2011-01-13 ENCOUNTER — Ambulatory Visit (INDEPENDENT_AMBULATORY_CARE_PROVIDER_SITE_OTHER): Payer: Medicare FFS | Admitting: Internal Medicine

## 2011-01-13 DIAGNOSIS — R42 Dizziness and giddiness: Secondary | ICD-10-CM

## 2011-01-13 DIAGNOSIS — I10 Essential (primary) hypertension: Secondary | ICD-10-CM

## 2011-01-13 DIAGNOSIS — Z7901 Long term (current) use of anticoagulants: Secondary | ICD-10-CM

## 2011-01-13 DIAGNOSIS — G459 Transient cerebral ischemic attack, unspecified: Secondary | ICD-10-CM

## 2011-01-13 DIAGNOSIS — I4891 Unspecified atrial fibrillation: Secondary | ICD-10-CM

## 2011-01-13 LAB — POCT INR: INR: 1.6

## 2011-01-13 NOTE — Patient Instructions (Signed)
Your physician wants you to follow-up in: 6 months with Dr. Taylor. You will receive a reminder letter in the mail two months in advance. If you don't receive a letter, please call our office to schedule the follow-up appointment.  Your physician recommends that you continue on your current medications as directed. Please refer to the Current Medication list given to you today.  

## 2011-01-14 ENCOUNTER — Encounter: Payer: Self-pay | Admitting: Internal Medicine

## 2011-01-14 DIAGNOSIS — R42 Dizziness and giddiness: Secondary | ICD-10-CM | POA: Insufficient documentation

## 2011-01-14 NOTE — Assessment & Plan Note (Signed)
His blood pressure is well controlled. He is instructed to maintain a low sodium diet.

## 2011-01-14 NOTE — Assessment & Plan Note (Signed)
He is certain that while he is not normal, his symptoms have improved off of amiodarone. He will continue a strategy of rate control though it appears that he is still maintaining NSR.

## 2011-01-14 NOTE — Progress Notes (Signed)
HPI [name] returns today for followup. He is a pleasant 77 yo man with a h/o persistent AF, previously on amiodarone, with severe dizziness. He is s/p discontinuation of amio therapy. He dizziness improved has not resolved. He denies c/p, sob or palpitations. No syncope. He has not fallen.  Allergies  Allergen Reactions  . Amiodarone Hcl     Loose strength in legs.     Current Outpatient Prescriptions  Medication Sig Dispense Refill  . aspirin 81 MG tablet Take 81 mg by mouth daily.        . Cinnamon 500 MG TABS Take 2 tablets by mouth daily.       Marland Kitchen doxazosin (CARDURA) 4 MG tablet Take 4 mg by mouth at bedtime.        . finasteride (PROSCAR) 5 MG tablet Take 5 mg by mouth daily.       Marland Kitchen gabapentin (NEURONTIN) 300 MG capsule Take 300 mg by mouth 3 (three) times daily.       Marland Kitchen levothyroxine (SYNTHROID, LEVOTHROID) 75 MCG tablet Take 75 mcg by mouth daily.        . metoprolol (LOPRESSOR) 50 MG tablet Take 50 mg by mouth 2 (two) times daily.        . sennosides-docusate sodium (SENOKOT-S) 8.6-50 MG tablet Take 1 tablet by mouth daily.        Marland Kitchen warfarin (COUMADIN) 5 MG tablet Take 1 tablet (5 mg total) by mouth daily.  45 tablet  3     Past Medical History  Diagnosis Date  . Carotid artery occlusion     carotid artery stenosis  . Transient ischemic attack   . Hypertension   . Myocardial infarction     ROS:   All systems reviewed and negative except as noted in the HPI.   Past Surgical History  Procedure Date  . Coronary artery bypass graft     coronary artery bypass grafting time 6 with left internal mammery to ythe left anterior descending coronary artery ,reverse saphenous vein graft to the intermediate coronary artery sequential reverse saphenous vein graft to the first obtuse marginal and distal circumflex,sequential reverse saphenous vein graft to the acute  marginal and posterior descending coronary artery branch of the right corh     No family history on  file.   History   Social History  . Marital Status: Married    Spouse Name: N/A    Number of Children: N/A  . Years of Education: N/A   Occupational History  . Not on file.   Social History Main Topics  . Smoking status: Never Smoker   . Smokeless tobacco: Never Used  . Alcohol Use: No  . Drug Use: No  . Sexually Active: Not on file   Other Topics Concern  . Not on file   Social History Narrative  . No narrative on file     BP 120/68  Pulse 60  Resp 18  Ht 5\' 9"  (1.753 m)  Wt 177 lb 6.4 oz (80.468 kg)  BMI 26.20 kg/m2  SpO2 95%  Physical Exam:  Chronically ill appearing NAD HEENT: Unremarkable Neck:  No JVD, no thyromegally Lymphatics:  No adenopathy Back:  No CVA tenderness Lungs:  Clear HEART:  Iregular rate rhythm, no murmurs, no rubs, no clicks Abd:  soft, positive bowel sounds, no organomegally, no rebound, no guarding Ext:  2 plus pulses, no edema, no cyanosis, no clubbing Skin:  No rashes no nodules Neuro:  CN II through XII  intact, motor grossly intact  Assess/Plan:

## 2011-01-27 ENCOUNTER — Ambulatory Visit (INDEPENDENT_AMBULATORY_CARE_PROVIDER_SITE_OTHER): Payer: Medicare FFS | Admitting: *Deleted

## 2011-01-27 DIAGNOSIS — I4891 Unspecified atrial fibrillation: Secondary | ICD-10-CM

## 2011-01-27 DIAGNOSIS — G459 Transient cerebral ischemic attack, unspecified: Secondary | ICD-10-CM

## 2011-01-27 DIAGNOSIS — Z7901 Long term (current) use of anticoagulants: Secondary | ICD-10-CM

## 2011-02-17 ENCOUNTER — Ambulatory Visit (INDEPENDENT_AMBULATORY_CARE_PROVIDER_SITE_OTHER): Payer: Medicare FFS | Admitting: *Deleted

## 2011-02-17 DIAGNOSIS — Z7901 Long term (current) use of anticoagulants: Secondary | ICD-10-CM

## 2011-02-17 DIAGNOSIS — I4891 Unspecified atrial fibrillation: Secondary | ICD-10-CM

## 2011-02-17 DIAGNOSIS — G459 Transient cerebral ischemic attack, unspecified: Secondary | ICD-10-CM

## 2011-03-17 ENCOUNTER — Ambulatory Visit (INDEPENDENT_AMBULATORY_CARE_PROVIDER_SITE_OTHER): Payer: Medicare FFS | Admitting: *Deleted

## 2011-03-17 DIAGNOSIS — I4891 Unspecified atrial fibrillation: Secondary | ICD-10-CM

## 2011-03-17 DIAGNOSIS — Z7901 Long term (current) use of anticoagulants: Secondary | ICD-10-CM

## 2011-03-17 DIAGNOSIS — G459 Transient cerebral ischemic attack, unspecified: Secondary | ICD-10-CM

## 2011-03-17 LAB — POCT INR: INR: 2.4

## 2011-04-14 ENCOUNTER — Ambulatory Visit (INDEPENDENT_AMBULATORY_CARE_PROVIDER_SITE_OTHER): Payer: Medicare FFS | Admitting: *Deleted

## 2011-04-14 DIAGNOSIS — I4891 Unspecified atrial fibrillation: Secondary | ICD-10-CM

## 2011-04-14 DIAGNOSIS — G459 Transient cerebral ischemic attack, unspecified: Secondary | ICD-10-CM

## 2011-04-14 DIAGNOSIS — Z7901 Long term (current) use of anticoagulants: Secondary | ICD-10-CM

## 2011-04-14 LAB — POCT INR: INR: 2.2

## 2011-05-19 ENCOUNTER — Ambulatory Visit (INDEPENDENT_AMBULATORY_CARE_PROVIDER_SITE_OTHER): Payer: Medicare FFS | Admitting: *Deleted

## 2011-05-19 DIAGNOSIS — Z7901 Long term (current) use of anticoagulants: Secondary | ICD-10-CM

## 2011-05-19 DIAGNOSIS — I4891 Unspecified atrial fibrillation: Secondary | ICD-10-CM

## 2011-05-19 DIAGNOSIS — G459 Transient cerebral ischemic attack, unspecified: Secondary | ICD-10-CM

## 2011-05-19 LAB — POCT INR: INR: 2

## 2011-06-16 ENCOUNTER — Ambulatory Visit (INDEPENDENT_AMBULATORY_CARE_PROVIDER_SITE_OTHER): Payer: Medicare FFS | Admitting: *Deleted

## 2011-06-16 DIAGNOSIS — G459 Transient cerebral ischemic attack, unspecified: Secondary | ICD-10-CM

## 2011-06-16 DIAGNOSIS — Z7901 Long term (current) use of anticoagulants: Secondary | ICD-10-CM

## 2011-06-16 DIAGNOSIS — I4891 Unspecified atrial fibrillation: Secondary | ICD-10-CM

## 2011-06-16 LAB — POCT INR: INR: 2.2

## 2011-07-28 ENCOUNTER — Ambulatory Visit (INDEPENDENT_AMBULATORY_CARE_PROVIDER_SITE_OTHER): Payer: Medicare FFS | Admitting: *Deleted

## 2011-07-28 DIAGNOSIS — G459 Transient cerebral ischemic attack, unspecified: Secondary | ICD-10-CM

## 2011-07-28 DIAGNOSIS — Z7901 Long term (current) use of anticoagulants: Secondary | ICD-10-CM

## 2011-07-28 DIAGNOSIS — I4891 Unspecified atrial fibrillation: Secondary | ICD-10-CM

## 2011-07-28 LAB — POCT INR: INR: 1.7

## 2011-07-30 ENCOUNTER — Encounter: Payer: Self-pay | Admitting: Internal Medicine

## 2011-07-30 ENCOUNTER — Ambulatory Visit (INDEPENDENT_AMBULATORY_CARE_PROVIDER_SITE_OTHER): Payer: Medicare FFS | Admitting: Internal Medicine

## 2011-07-30 VITALS — BP 143/89 | HR 68 | Resp 18 | Ht 69.0 in | Wt 181.0 lb

## 2011-07-30 DIAGNOSIS — I4891 Unspecified atrial fibrillation: Secondary | ICD-10-CM

## 2011-07-30 DIAGNOSIS — Z7901 Long term (current) use of anticoagulants: Secondary | ICD-10-CM

## 2011-07-30 NOTE — Patient Instructions (Signed)
Your physician recommends that you schedule a follow-up appointment in: 1 year  

## 2011-08-02 ENCOUNTER — Encounter: Payer: Self-pay | Admitting: Internal Medicine

## 2011-08-02 NOTE — Assessment & Plan Note (Signed)
His ventricular rate appears to be well controlled. He will continue his current meds. 

## 2011-08-02 NOTE — Assessment & Plan Note (Signed)
Today, we spent nearly 30 min discussing the risks/benefits/ of coumadin. His CHADS score is 4. I have strongly urged him to continue Warfarin. He will continue for now.

## 2011-08-02 NOTE — Progress Notes (Signed)
HPI Mr. Mende returns today for followup. He is a pleasant 78 yo man with a h/o atrial fib, CAD, s/p CABG, HTN, TIA's and bronchitis. Recently he has been bothered by a productive cough and has been taking multiple different meds for presumed bronchitis. He desires to stop taking his anti-coagulation. No syncope or chest pain. He has class 2 dyspnea which is multi-factorial. Allergies  Allergen Reactions  . Amiodarone Hcl     Loose strength in legs.     Current Outpatient Prescriptions  Medication Sig Dispense Refill  . aspirin 81 MG tablet Take 81 mg by mouth daily.        . Cinnamon 500 MG TABS Take 2 tablets by mouth daily.       Marland Kitchen doxazosin (CARDURA) 4 MG tablet Take 4 mg by mouth at bedtime.        . finasteride (PROSCAR) 5 MG tablet Take 5 mg by mouth daily.       Marland Kitchen levothyroxine (SYNTHROID, LEVOTHROID) 88 MCG tablet Take 88 mcg by mouth daily.      . metoprolol (LOPRESSOR) 50 MG tablet Take 50 mg by mouth 2 (two) times daily.        . sennosides-docusate sodium (SENOKOT-S) 8.6-50 MG tablet Take 1 tablet by mouth daily.        Marland Kitchen warfarin (COUMADIN) 5 MG tablet Take 1 tablet (5 mg total) by mouth daily.  45 tablet  3     Past Medical History  Diagnosis Date  . Carotid artery occlusion     carotid artery stenosis  . Transient ischemic attack   . Hypertension   . Myocardial infarction     ROS:   All systems reviewed and negative except as noted in the HPI.   Past Surgical History  Procedure Date  . Coronary artery bypass graft     coronary artery bypass grafting time 6 with left internal mammery to ythe left anterior descending coronary artery ,reverse saphenous vein graft to the intermediate coronary artery sequential reverse saphenous vein graft to the first obtuse marginal and distal circumflex,sequential reverse saphenous vein graft to the acute  marginal and posterior descending coronary artery branch of the right corh     No family history on file.   History    Social History  . Marital Status: Married    Spouse Name: N/A    Number of Children: N/A  . Years of Education: N/A   Occupational History  . Not on file.   Social History Main Topics  . Smoking status: Never Smoker   . Smokeless tobacco: Never Used  . Alcohol Use: No  . Drug Use: No  . Sexually Active: Not on file   Other Topics Concern  . Not on file   Social History Narrative  . No narrative on file     BP 143/89  Pulse 68  Resp 18  Ht 5\' 9"  (1.753 m)  Wt 82.101 kg (181 lb)  BMI 26.73 kg/m2  Physical Exam:  Well appearing 78 yo man, NAD HEENT: Unremarkable Neck:  No JVD, no thyromegally Lungs:  no wheezes, rales. Scattered rhonchi HEART:  Regular rate rhythm, no murmurs, no rubs, no clicks Abd:  soft, positive bowel sounds, no organomegally, no rebound, no guarding Ext:  2 plus pulses, no edema, no cyanosis, no clubbing Skin:  No rashes no nodules Neuro:  CN II through XII intact, motor grossly intact   Assess/Plan:

## 2011-08-25 ENCOUNTER — Ambulatory Visit (INDEPENDENT_AMBULATORY_CARE_PROVIDER_SITE_OTHER): Payer: Medicare FFS | Admitting: *Deleted

## 2011-08-25 DIAGNOSIS — I4891 Unspecified atrial fibrillation: Secondary | ICD-10-CM

## 2011-08-25 DIAGNOSIS — G459 Transient cerebral ischemic attack, unspecified: Secondary | ICD-10-CM

## 2011-08-25 DIAGNOSIS — Z7901 Long term (current) use of anticoagulants: Secondary | ICD-10-CM

## 2011-08-25 LAB — POCT INR: INR: 2.6

## 2011-09-22 ENCOUNTER — Ambulatory Visit (INDEPENDENT_AMBULATORY_CARE_PROVIDER_SITE_OTHER): Payer: Medicare FFS | Admitting: *Deleted

## 2011-09-22 DIAGNOSIS — I4891 Unspecified atrial fibrillation: Secondary | ICD-10-CM

## 2011-09-22 DIAGNOSIS — G459 Transient cerebral ischemic attack, unspecified: Secondary | ICD-10-CM

## 2011-09-22 DIAGNOSIS — Z7901 Long term (current) use of anticoagulants: Secondary | ICD-10-CM

## 2011-10-20 ENCOUNTER — Ambulatory Visit (INDEPENDENT_AMBULATORY_CARE_PROVIDER_SITE_OTHER): Payer: Medicare FFS | Admitting: *Deleted

## 2011-10-20 DIAGNOSIS — I4891 Unspecified atrial fibrillation: Secondary | ICD-10-CM

## 2011-10-20 DIAGNOSIS — G459 Transient cerebral ischemic attack, unspecified: Secondary | ICD-10-CM

## 2011-10-20 DIAGNOSIS — Z7901 Long term (current) use of anticoagulants: Secondary | ICD-10-CM

## 2011-12-01 ENCOUNTER — Ambulatory Visit (INDEPENDENT_AMBULATORY_CARE_PROVIDER_SITE_OTHER): Payer: Medicare FFS | Admitting: *Deleted

## 2011-12-01 DIAGNOSIS — G459 Transient cerebral ischemic attack, unspecified: Secondary | ICD-10-CM

## 2011-12-01 DIAGNOSIS — Z7901 Long term (current) use of anticoagulants: Secondary | ICD-10-CM

## 2011-12-01 DIAGNOSIS — I4891 Unspecified atrial fibrillation: Secondary | ICD-10-CM

## 2011-12-01 LAB — POCT INR: INR: 3.1

## 2012-01-05 ENCOUNTER — Ambulatory Visit (INDEPENDENT_AMBULATORY_CARE_PROVIDER_SITE_OTHER): Payer: Medicare FFS | Admitting: *Deleted

## 2012-01-05 DIAGNOSIS — Z7901 Long term (current) use of anticoagulants: Secondary | ICD-10-CM

## 2012-01-05 DIAGNOSIS — I4891 Unspecified atrial fibrillation: Secondary | ICD-10-CM

## 2012-01-05 DIAGNOSIS — G459 Transient cerebral ischemic attack, unspecified: Secondary | ICD-10-CM

## 2012-01-29 IMAGING — CR DG CHEST 1V PORT
1 series · 1 of 1 positions shown · non-contrast
Comparison: None

CLINICAL DATA: Chest pain.

PORTABLE CHEST - 1 VIEW

[view not recorded]
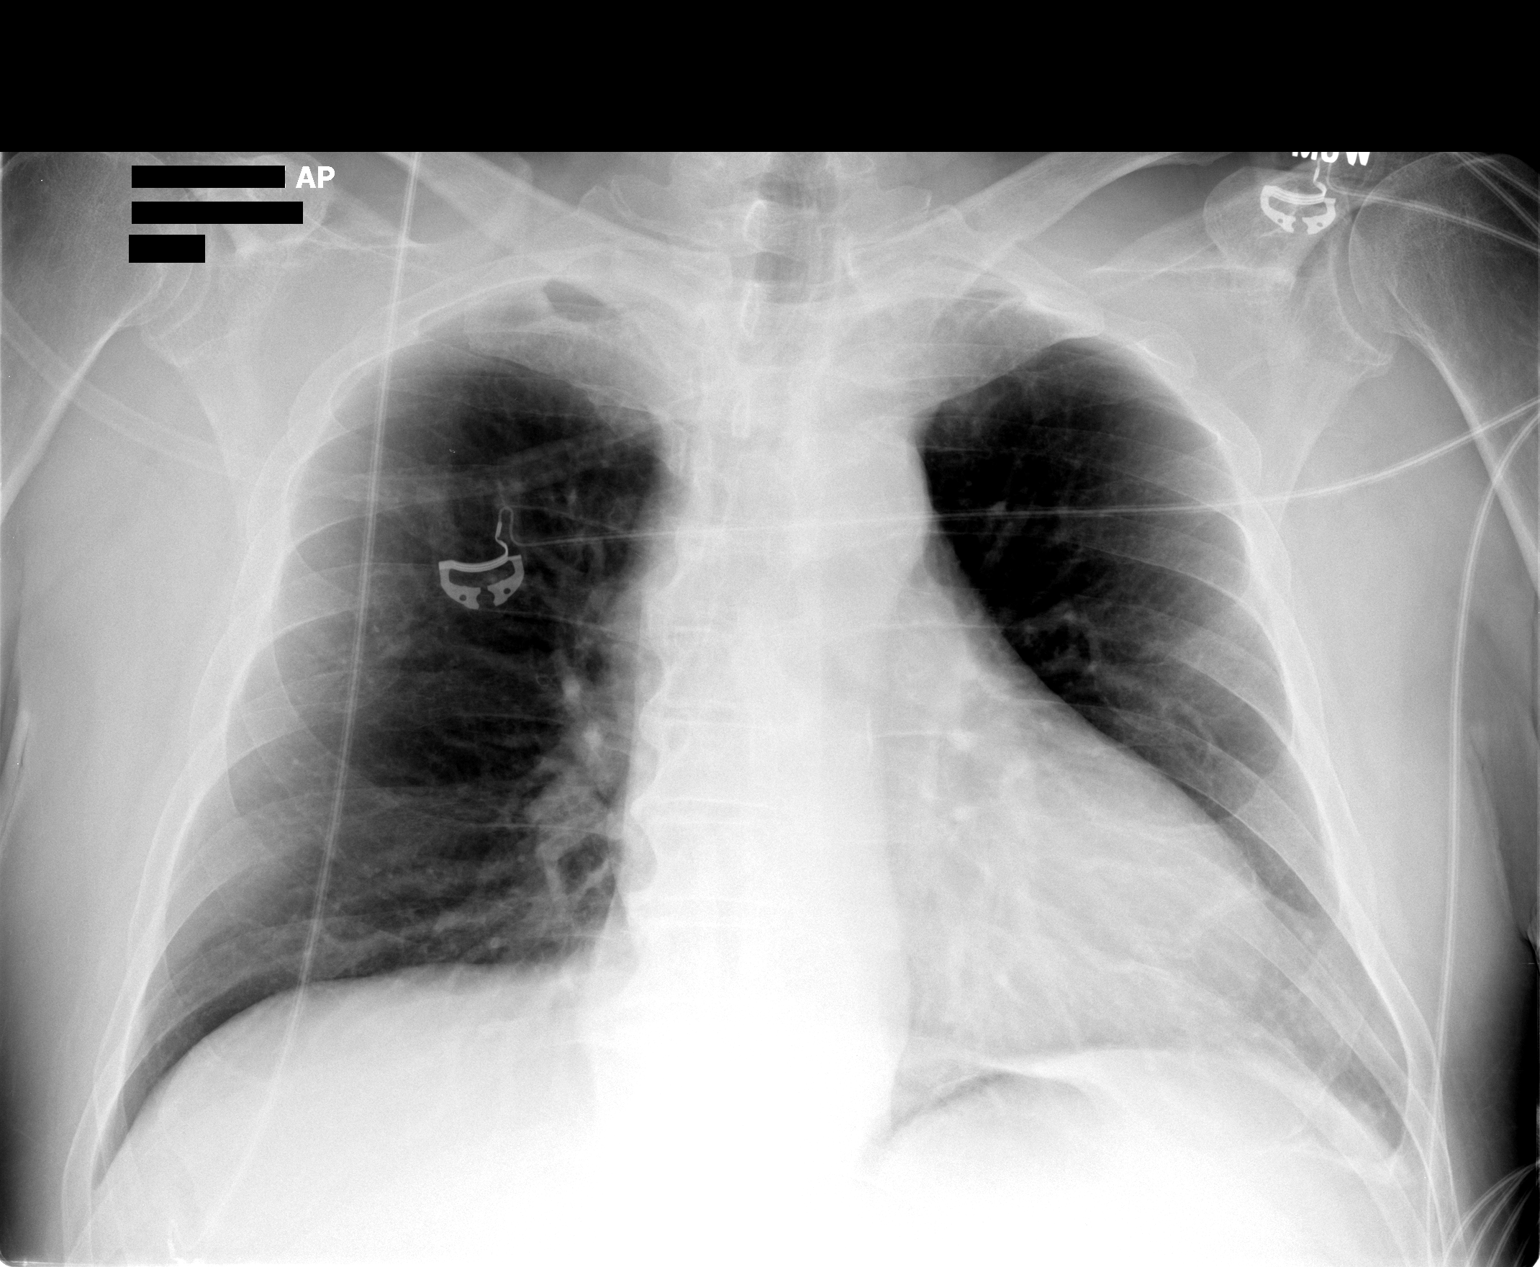

[1 of 1 positions shown; findings below may reference images not displayed]

FINDINGS: The heart is borderline enlarged with a left ventricular
configuration.  There is mild tortuosity and calcification of the
thoracic aorta.  The lungs are clear except for streaky left
basilar scarring or subsegmental atelectasis.  No edema or
effusions.  The bony thorax is intact.
IMPRESSION: 1.  Borderline cardiac enlargement.
2.  Streaky subsegmental left basilar atelectasis or scarring
change.
3.  No infiltrates, edema or effusions.

## 2012-02-02 ENCOUNTER — Ambulatory Visit (INDEPENDENT_AMBULATORY_CARE_PROVIDER_SITE_OTHER): Payer: Medicare FFS | Admitting: *Deleted

## 2012-02-02 DIAGNOSIS — G459 Transient cerebral ischemic attack, unspecified: Secondary | ICD-10-CM

## 2012-02-02 DIAGNOSIS — I4891 Unspecified atrial fibrillation: Secondary | ICD-10-CM

## 2012-02-02 DIAGNOSIS — Z7901 Long term (current) use of anticoagulants: Secondary | ICD-10-CM

## 2012-02-03 IMAGING — CR DG CHEST 1V PORT
1 series · 1 of 1 positions shown · non-contrast
Comparison: 06/23/2009.

CLINICAL DATA: 79-year-old male status post CABG.  Acute coronary
syndrome.

PORTABLE CHEST - 1 VIEW

[AP]
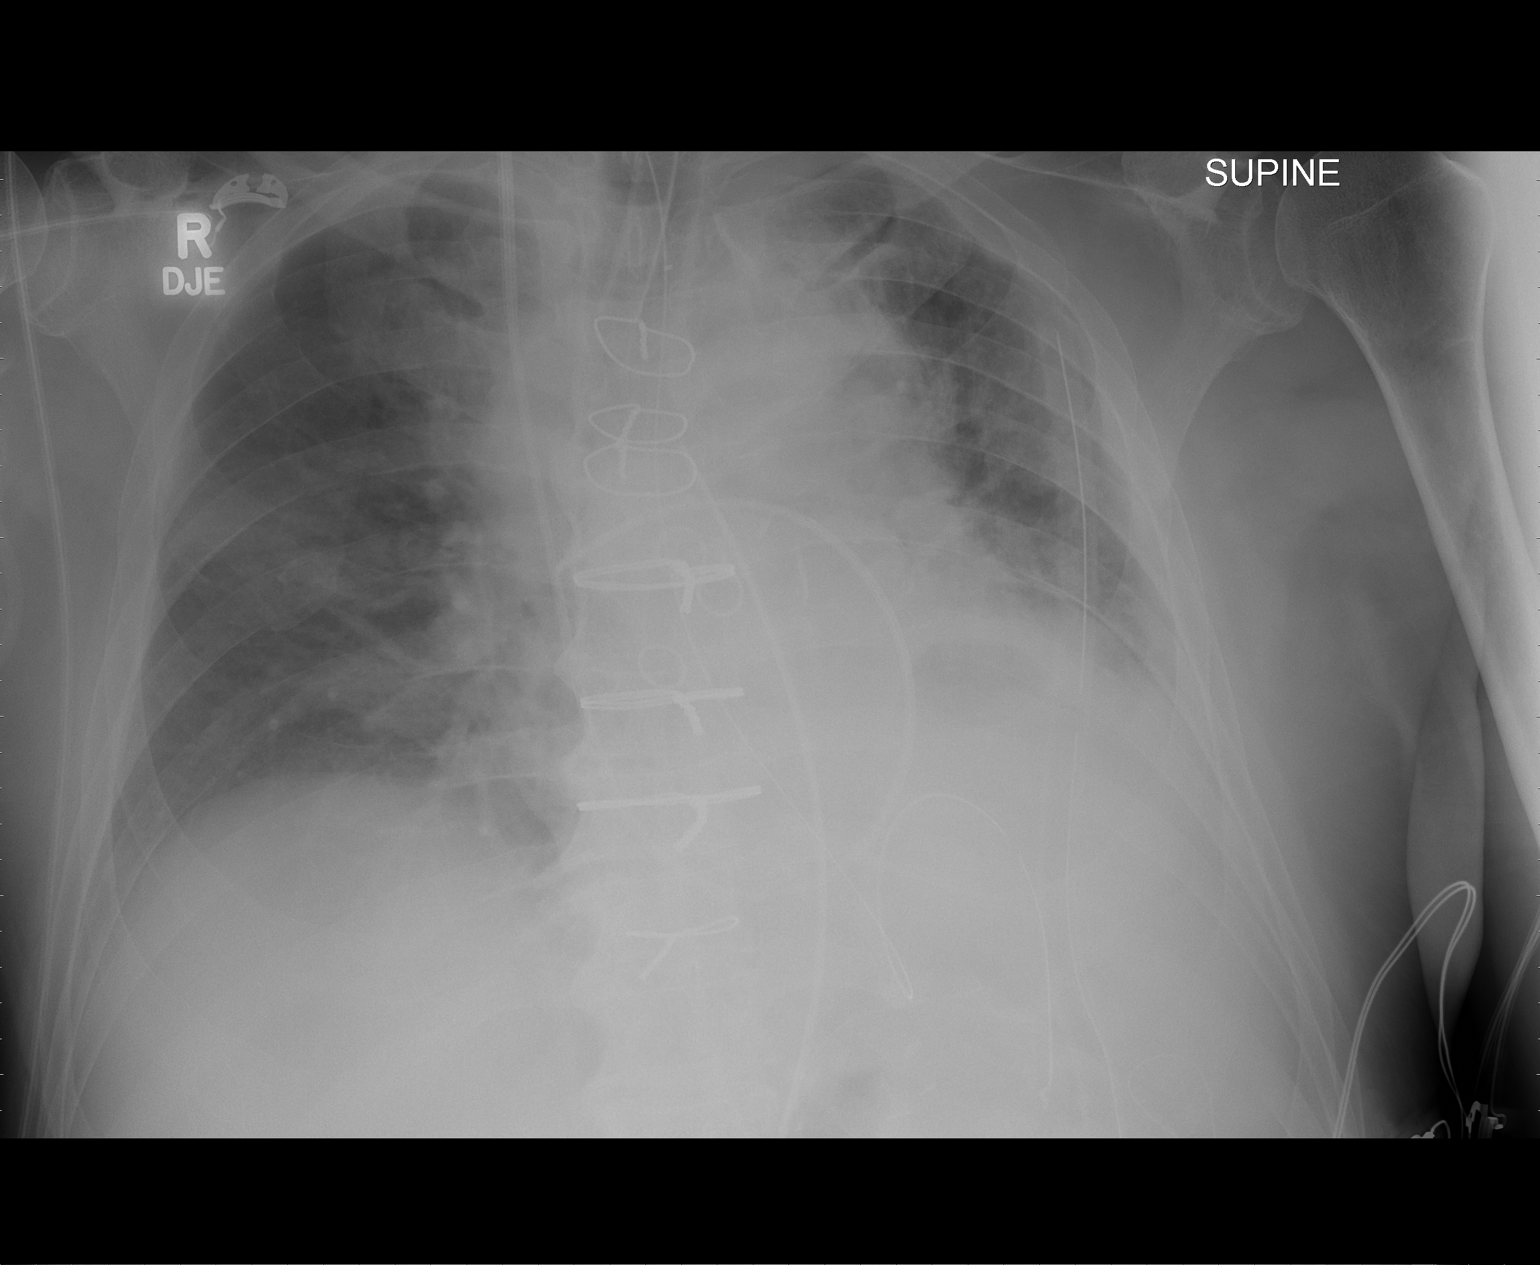

[1 of 1 positions shown; findings below may reference images not displayed]

FINDINGS: Portable AP supine view 1535 hours.  Endotracheal tube
tip between the level of the clavicles and carina.  Enteric tube
courses to the left upper quadrant and appears to be looped in the
gastric fundus.  Left hemidiaphragm appears elevated.  Right IJ
approach Swan-Ganz catheter tip at the level of the distal right
main pulmonary artery.  Left side chest tube and mediastinal drain.
The no pneumothorax.  Perihilar and basilar atelectasis.  This
obscures allow of the mediastinal contours.  Surgical clips right
upper quadrant.
IMPRESSION: 1.  Lines and tubes appear appropriately placed as above.  No
pneumothorax identified.
2.  Perihilar and bibasilar patchy opacity, favor atelectasis.

## 2012-02-04 IMAGING — CR DG CHEST 1V PORT
1 series · 1 of 1 positions shown · non-contrast
Comparison: 06/24/2009

CLINICAL DATA: History of CABG.

PORTABLE CHEST - 1 VIEW

[AP]
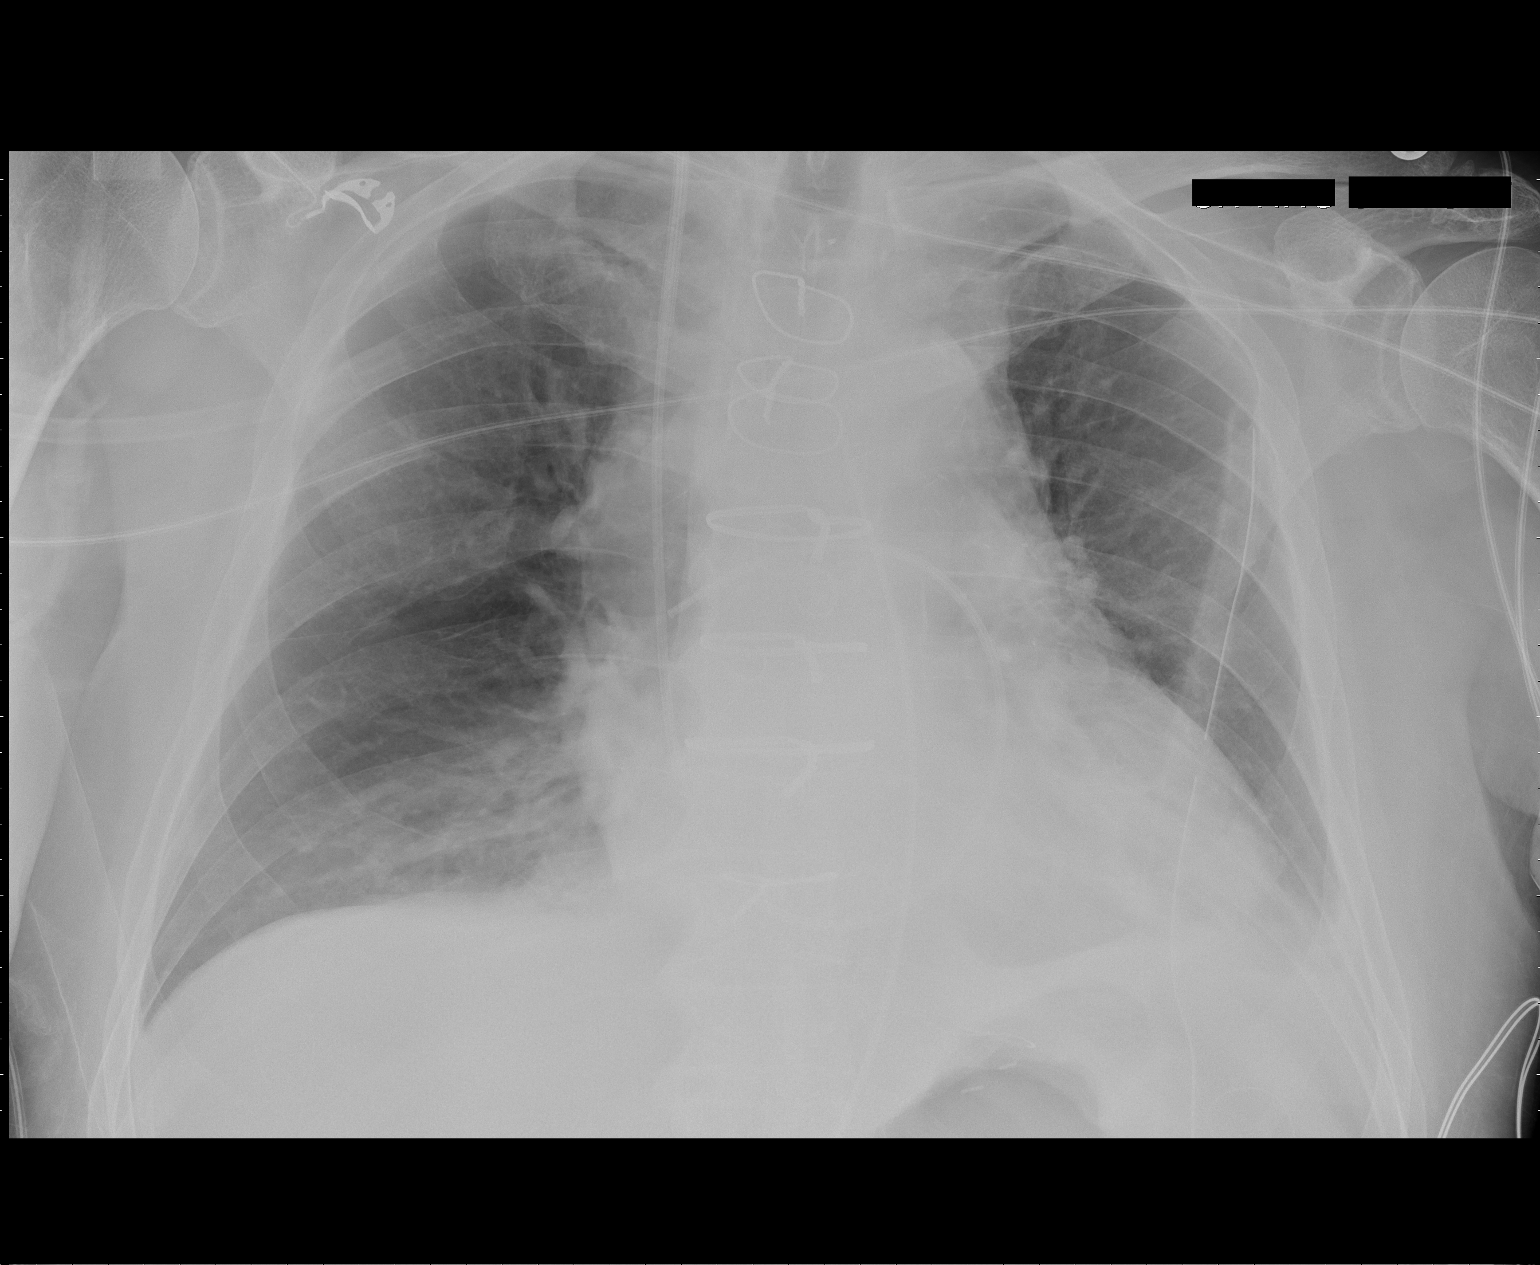

[1 of 1 positions shown; findings below may reference images not displayed]

FINDINGS: There has been interval removal of the enteric tube and
the endotracheal tube.  Right internal jugular Swan-Ganz catheter
is in place with tip in right pulmonary artery.  Left-sided chest
tube and left-sided mediastinal drain are unchanged in position.
No pneumothorax is evident.  There is elevation of right
hemidiaphragm with minimal atelectasis and infiltrate right base.
There is also minimal left basilar atelectasis.  There is
improvement in the infiltrate and atelectasis in left base since
previous study.  The margin of the left hemidiaphragm is now
partially visualized.  There is slight blunting of left
costophrenic angle which may reflect a small amount of left pleural
effusion.  There is stable minimal enlargement of the cardiac
silhouette. Bones appear average for age.
IMPRESSION: There has been interval removal of the enteric tube and the
endotracheal tube.  The Swan-Ganz catheter, mediastinal drain, and
left chest tube remain in place.  No pneumothorax is seen.  There
is slight improved aeration of the right lung base with minimal
residual infiltrative density and atelectasis with elevation of
right hemidiaphragm noted.  There is atelectasis in the left base
with probable small amount of left pleural effusion with patchy
infiltrative density.  There is improved aeration of the left base
compared to previous study. The cardiac silhouette is minimally
enlarged.

## 2012-03-05 IMAGING — CR DG CHEST 2V
2 series · 2 of 2 positions shown · non-contrast
Comparison: Portable chest x-ray of 06/30/2009

CLINICAL DATA: Status post CABG, follow-up

CHEST - 2 VIEW

[w chest pa]
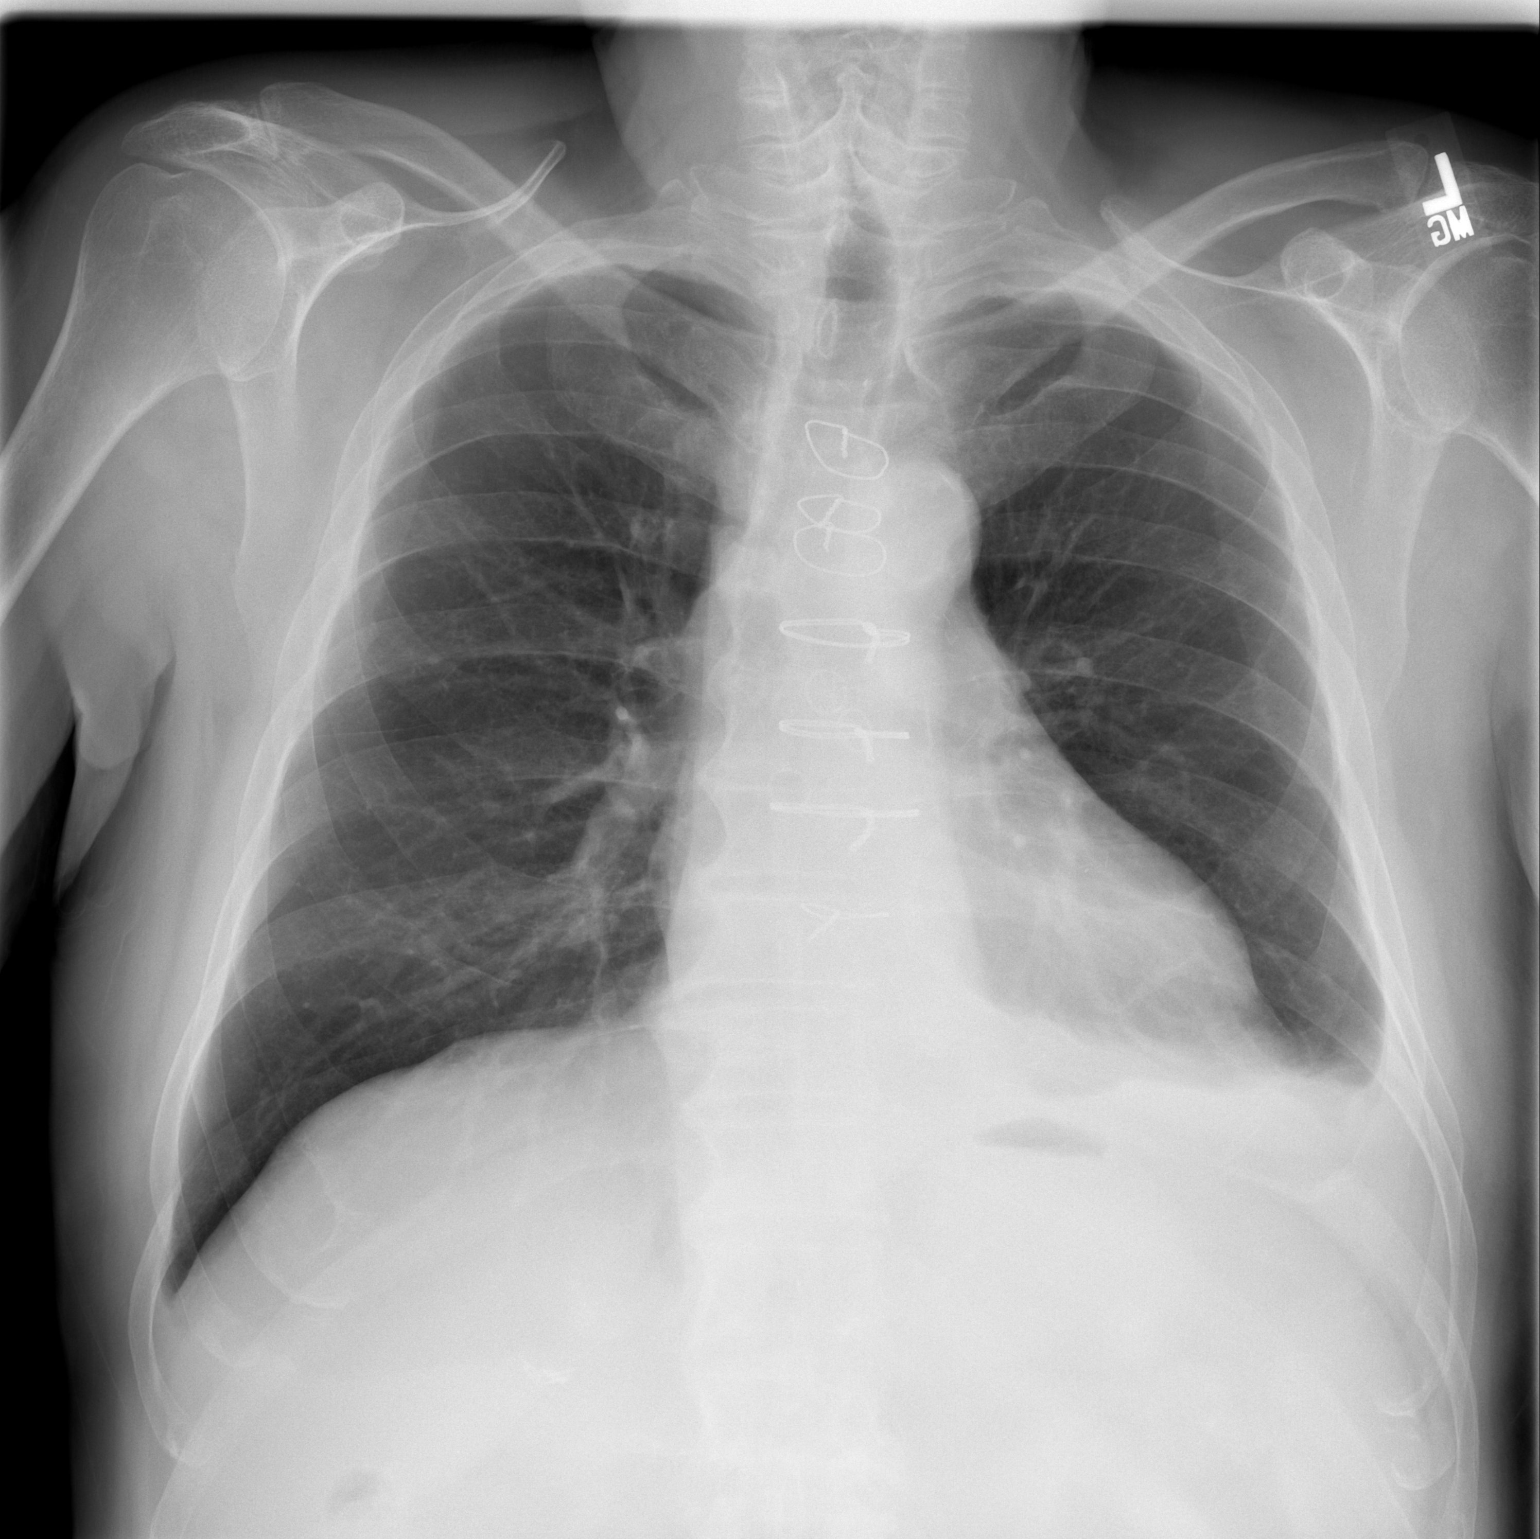

[w chest lat]
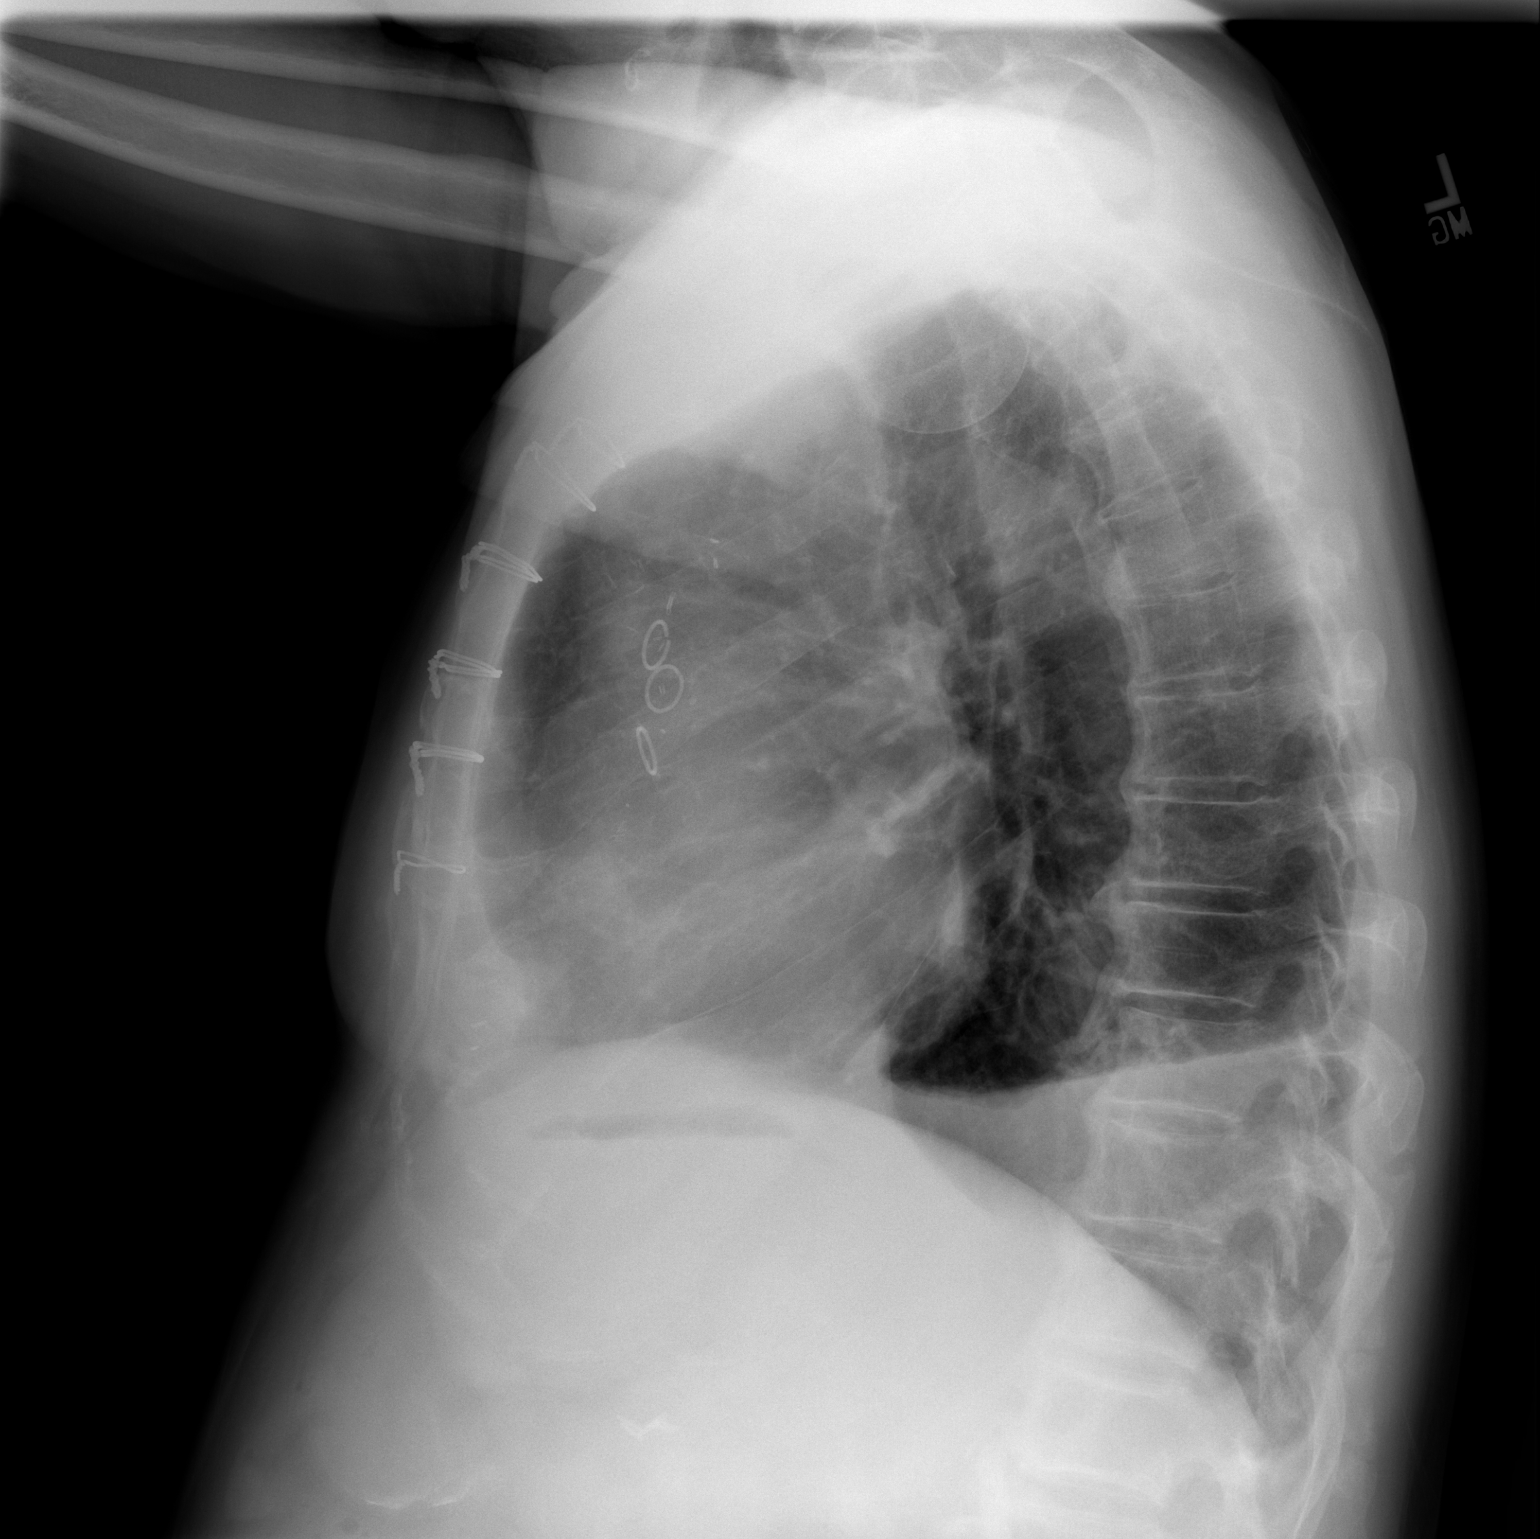

[2 of 2 positions shown; findings below may reference images not displayed]

FINDINGS: Aeration has improved.  The small right effusion has
resolved, with a small left effusion remaining.  Mild left basilar
atelectasis is present.  Cardiomegaly is stable.  Median sternotomy
sutures are noted.
IMPRESSION: Improved aeration with only small left effusion and mild left
basilar atelectasis remaining.

## 2012-03-08 ENCOUNTER — Ambulatory Visit (INDEPENDENT_AMBULATORY_CARE_PROVIDER_SITE_OTHER): Payer: Medicare FFS | Admitting: *Deleted

## 2012-03-08 DIAGNOSIS — Z7901 Long term (current) use of anticoagulants: Secondary | ICD-10-CM

## 2012-03-08 DIAGNOSIS — G459 Transient cerebral ischemic attack, unspecified: Secondary | ICD-10-CM

## 2012-03-08 DIAGNOSIS — I4891 Unspecified atrial fibrillation: Secondary | ICD-10-CM

## 2012-03-08 LAB — POCT INR: INR: 2.1

## 2012-04-19 ENCOUNTER — Ambulatory Visit (INDEPENDENT_AMBULATORY_CARE_PROVIDER_SITE_OTHER): Payer: Medicare FFS | Admitting: *Deleted

## 2012-04-19 DIAGNOSIS — G459 Transient cerebral ischemic attack, unspecified: Secondary | ICD-10-CM

## 2012-04-19 DIAGNOSIS — Z7901 Long term (current) use of anticoagulants: Secondary | ICD-10-CM

## 2012-04-19 DIAGNOSIS — I4891 Unspecified atrial fibrillation: Secondary | ICD-10-CM

## 2012-04-19 LAB — POCT INR: INR: 2.3

## 2012-05-31 ENCOUNTER — Ambulatory Visit (INDEPENDENT_AMBULATORY_CARE_PROVIDER_SITE_OTHER): Payer: Medicare FFS | Admitting: *Deleted

## 2012-05-31 DIAGNOSIS — G459 Transient cerebral ischemic attack, unspecified: Secondary | ICD-10-CM

## 2012-05-31 DIAGNOSIS — I4891 Unspecified atrial fibrillation: Secondary | ICD-10-CM

## 2012-05-31 DIAGNOSIS — Z7901 Long term (current) use of anticoagulants: Secondary | ICD-10-CM

## 2012-05-31 LAB — POCT INR: INR: 2.6

## 2012-06-01 ENCOUNTER — Encounter (INDEPENDENT_AMBULATORY_CARE_PROVIDER_SITE_OTHER): Payer: Self-pay | Admitting: *Deleted

## 2012-07-12 ENCOUNTER — Ambulatory Visit (INDEPENDENT_AMBULATORY_CARE_PROVIDER_SITE_OTHER): Payer: Medicare FFS | Admitting: *Deleted

## 2012-07-12 DIAGNOSIS — Z7901 Long term (current) use of anticoagulants: Secondary | ICD-10-CM

## 2012-07-12 DIAGNOSIS — G459 Transient cerebral ischemic attack, unspecified: Secondary | ICD-10-CM

## 2012-07-12 DIAGNOSIS — I4891 Unspecified atrial fibrillation: Secondary | ICD-10-CM

## 2012-08-01 ENCOUNTER — Ambulatory Visit (INDEPENDENT_AMBULATORY_CARE_PROVIDER_SITE_OTHER): Payer: Medicare FFS | Admitting: Internal Medicine

## 2012-08-01 ENCOUNTER — Other Ambulatory Visit (INDEPENDENT_AMBULATORY_CARE_PROVIDER_SITE_OTHER): Payer: Self-pay | Admitting: *Deleted

## 2012-08-01 ENCOUNTER — Encounter: Payer: Self-pay | Admitting: Internal Medicine

## 2012-08-01 ENCOUNTER — Encounter (INDEPENDENT_AMBULATORY_CARE_PROVIDER_SITE_OTHER): Payer: Self-pay | Admitting: Internal Medicine

## 2012-08-01 ENCOUNTER — Encounter (INDEPENDENT_AMBULATORY_CARE_PROVIDER_SITE_OTHER): Payer: Self-pay | Admitting: *Deleted

## 2012-08-01 ENCOUNTER — Telehealth: Payer: Self-pay | Admitting: *Deleted

## 2012-08-01 VITALS — BP 130/70 | HR 80 | Temp 97.2°F | Resp 18 | Ht 69.0 in | Wt 176.4 lb

## 2012-08-01 VITALS — BP 145/80 | HR 94 | Wt 176.0 lb

## 2012-08-01 DIAGNOSIS — I4891 Unspecified atrial fibrillation: Secondary | ICD-10-CM

## 2012-08-01 DIAGNOSIS — I10 Essential (primary) hypertension: Secondary | ICD-10-CM

## 2012-08-01 DIAGNOSIS — K219 Gastro-esophageal reflux disease without esophagitis: Secondary | ICD-10-CM | POA: Insufficient documentation

## 2012-08-01 DIAGNOSIS — R1319 Other dysphagia: Secondary | ICD-10-CM

## 2012-08-01 DIAGNOSIS — R1310 Dysphagia, unspecified: Secondary | ICD-10-CM

## 2012-08-01 DIAGNOSIS — I2510 Atherosclerotic heart disease of native coronary artery without angina pectoris: Secondary | ICD-10-CM

## 2012-08-01 MED ORDER — PANTOPRAZOLE SODIUM 40 MG PO TBEC: 40.0000 mg | DELAYED_RELEASE_TABLET | Freq: Every day | ORAL | Status: DC

## 2012-08-01 NOTE — Assessment & Plan Note (Signed)
He denies anginal symptoms. I've encouraged the patient increase his physical activity.

## 2012-08-01 NOTE — Progress Notes (Signed)
HPI [name] returns today for followup. He is a very pleasant 79 year old man with paroxysmal/persistent atrial fibrillation, coronary artery disease, history of TIAs, status post bypass surgery, with hypertension, and dyslipidemia.  He has previously not wanted to take systemic anticoagulation but has reluctantly agreed to do so. His main complaint today is difficulty with sleep. His daughter who is with him today notes that he wakes up at night time several times, and sleeps during the day. He denies chest pain, shortness of breath, or peripheral edema. Minimal palpitations are noted. Allergies  Allergen Reactions  . Amiodarone Hcl     Loose strength in legs.     Current Outpatient Prescriptions  Medication Sig Dispense Refill  . aspirin 81 MG tablet Take 81 mg by mouth daily.        . cefUROXime (CEFTIN) 250 MG tablet       . Cinnamon 500 MG TABS Take 2 tablets by mouth daily.       Marland Kitchen doxazosin (CARDURA) 4 MG tablet Take 4 mg by mouth at bedtime.        . finasteride (PROSCAR) 5 MG tablet Take 5 mg by mouth daily.       . fluticasone (FLONASE) 50 MCG/ACT nasal spray       . levothyroxine (SYNTHROID, LEVOTHROID) 50 MCG tablet Take 50 mcg by mouth daily.      . metoprolol (LOPRESSOR) 50 MG tablet Take 50 mg by mouth 2 (two) times daily.        Marland Kitchen omeprazole (PRILOSEC) 40 MG capsule Take 40 mg by mouth daily.      . sennosides-docusate sodium (SENOKOT-S) 8.6-50 MG tablet Take 1 tablet by mouth daily.        Marland Kitchen warfarin (COUMADIN) 5 MG tablet Take 1 tablet (5 mg total) by mouth daily.  45 tablet  3   No current facility-administered medications for this visit.     Past Medical History  Diagnosis Date  . Carotid artery occlusion     carotid artery stenosis  . Transient ischemic attack   . Hypertension   . Myocardial infarction     ROS:   All systems reviewed and negative except as noted in the HPI.   Past Surgical History  Procedure Laterality Date  . Coronary artery bypass  graft      coronary artery bypass grafting time 6 with left internal mammery to ythe left anterior descending coronary artery ,reverse saphenous vein graft to the intermediate coronary artery sequential reverse saphenous vein graft to the first obtuse marginal and distal circumflex,sequential reverse saphenous vein graft to the acute  marginal and posterior descending coronary artery branch of the right corh     History reviewed. No pertinent family history.   History   Social History  . Marital Status: Married    Spouse Name: N/A    Number of Children: N/A  . Years of Education: N/A   Occupational History  . Not on file.   Social History Main Topics  . Smoking status: Never Smoker   . Smokeless tobacco: Never Used  . Alcohol Use: No  . Drug Use: No  . Sexually Active: Not on file   Other Topics Concern  . Not on file   Social History Narrative  . No narrative on file     BP 145/80  Pulse 94  Wt 176 lb (79.833 kg)  BMI 25.98 kg/m2  Physical Exam:  Well appearing 79 year old man,NAD HEENT: Unremarkable Neck:  7 cm JVD,  no thyromegally Lungs:  Clear with no wheezes, rales, or rhonchi. HEART:  IRegular rate rhythm, no murmurs, no rubs, no clicks Abd:  soft, positive bowel sounds, no organomegally, no rebound, no guarding Ext:  2 plus pulses, no edema, no cyanosis, no clubbing Skin:  No rashes no nodules Neuro:  CN II through XII intact, motor grossly intact  DEVICE  Normal device function.  See PaceArt for details.   Assess/Plan:

## 2012-08-01 NOTE — Assessment & Plan Note (Signed)
His rate and symptoms are well-controlled. He will continue systemic anticoagulation.

## 2012-08-01 NOTE — Patient Instructions (Addendum)
Esophagogastroduodenoscopy and esophageal dilation to be scheduled towards  end of April 2014. Office will call with instruction regarding warfarin after conferring with Dr. Ladona Ridgel. Take pantoprazole 40 mg by mouth 30 minutes before breakfast daily.

## 2012-08-01 NOTE — Assessment & Plan Note (Signed)
His blood pressure today is slightly elevated. I've asked to reduce his sodium intake. He will continue his current medical therapy.

## 2012-08-01 NOTE — Patient Instructions (Addendum)
Your physician recommends that you schedule a follow-up appointment in: 1 year  

## 2012-08-01 NOTE — Telephone Encounter (Signed)
Patient is to have EGD with dilation, per Dr Karilyn Cota on April 21 st and he is requesting that patient be off of coumadin 5 days prior and ASA 2 days prior.  Please advise.

## 2012-08-01 NOTE — Progress Notes (Signed)
Presenting complaint;  Dysphagia and hoarseness.  History of present illness; Patient is 79 year old Caucasian male who is referred through courtesy of Dr. Quintin Alto for GI evaluation he is accompanied by his wife today. He has history of dysphagia and esophageal stricture and was last dilated in March 2010 at [facility]. He now presents with dysphagia of several months duration and scratchy getting worse. He has difficulty swallowing soft pasty foods. He also has difficulty with cornbread. He has no difficulty with liquids. At times he he feels as if he is choking and now he's not eating or drinking. He feels he has lump in his throat. He denies heartburn. She's had hoarseness off and on since his cardiac surgery about 3 years ago. He was seen by Dr. Andrey Campanile of ENT service in [facility] one year ago and no abnormality was found. He had been on omeprazole which she discontinued several weeks ago. He rarely experiences heartburn. Several years ago he experienced intractable heartburn. His appetite is good but he has lost 6 pounds. He denies nausea vomiting abdominal pain melena or rectal bleeding. He has had some balance issues but luckily has not fallen recently. He does walk about 30 minutes daily usually on a treadmill and lifts weights to prevent muscle atrophy. Regarding his stroke symptoms he's been treated with nasal spray as well as antibiotics but could not tell the difference.  Current Medications: Current Outpatient Prescriptions  Medication Sig Dispense Refill  . aspirin 81 MG tablet Take 81 mg by mouth daily.        . Cinnamon 500 MG TABS Take 1,000 mg by mouth daily.       Marland Kitchen doxazosin (CARDURA) 4 MG tablet Take 4 mg by mouth at bedtime.        . finasteride (PROSCAR) 5 MG tablet Take 5 mg by mouth daily.       . fluticasone (FLONASE) 50 MCG/ACT nasal spray Place 1 spray into the nose daily.       Marland Kitchen levothyroxine (SYNTHROID, LEVOTHROID) 50 MCG tablet Take 50 mcg by mouth daily.       . metoprolol (LOPRESSOR) 50 MG tablet Take 50 mg by mouth 2 (two) times daily.        Marland Kitchen omeprazole (PRILOSEC) 40 MG capsule Take 40 mg by mouth daily.      Marland Kitchen warfarin (COUMADIN) 5 MG tablet Take 1 tablet (5 mg total) by mouth daily.  45 tablet  3   No current facility-administered medications for this visit.   Past medical history; Hypertension. Coronary artery disease. Status post CABG in February, 2011. Atrial fibrillation. He is chronically anticoagulated. History of TIAs. Carotid artery stenosis Hyperlipidemia. Hypothyroidism. GERD complicated by esophageal stricture EGD ED in March 2010. Cholecystectomy in 2008. Lumbar spine surgery for 2 ruptured discs in 2006. He has residual nerve damage and right leg weakness. History of colonic polyps. Had 2 adenomas removed in 2003 polyps removed in 2004. Status post right inguinal and umbilical hernia repair. History of kidney stones. BPH. Allergies; Allergies  Allergen Reactions  . Amiodarone Hcl     Loose strength in legs.   Family history; Noncontributory. Both parents are deceased. Social history; He is married and has 2 daughters. He worked at [employer] for 44 years until his disability in 2007. He does not smoke cigarettes or drink alcohol.     Objective: Blood pressure 130/70, pulse 80, temperature 97.2 F (36.2 C), temperature source Oral, resp. rate 18, height 5\' 9"  (1.753 m),  weight 176 lb 6.4 oz (80.015 kg). Patient is alert and in no acute distress. He is hoarse. Conjunctiva is pink. Sclera is nonicteric Oropharyngeal mucosa is normal. No neck masses or thyromegaly noted. Cardiac exam with irregular rhythm. Normal S1 and S2. No murmur or gallop noted. Lungs are clear to auscultation. Abdomen is symmetrical soft and nontender without organomegaly or masses.  No LE edema or clubbing noted.   Assessment:  #1. Dysphagia. Patient has history of distal esophageal stricture which was dilated from 15-18 mm for  years ago. He may benefit from repeat dilation of this stricture. Previous intervention provided significant relief for several months. Given his age he may also have motility disorder. He will need to be off warfarin for few days prior to EGD/ED. #2. Chronic hoarseness. The symptoms apparently started following his CABG. ENT evaluation one year ago was negative. His hoarseness may be atypical manifestation of GERD since he is not having frequent heartburn. Since he has history of esophageal stricture and remote history of intractable heartburn he needs to be back on PPI.    Recommendations; Pantoprazole 40 mg by mouth every morning. Esophagogastroduodenoscopy with esophageal dilation to be scheduled next month. Will check with Dr. Ladona Ridgel for warfarin held for 5 days. If hoarseness persists he may need to be reevaluated by Dr. Andrey Campanile, patient's ENT specialist.

## 2012-08-02 NOTE — Telephone Encounter (Signed)
Please advise 

## 2012-08-03 ENCOUNTER — Encounter (INDEPENDENT_AMBULATORY_CARE_PROVIDER_SITE_OTHER): Payer: Self-pay | Admitting: Internal Medicine

## 2012-08-08 NOTE — Telephone Encounter (Signed)
Please Advise

## 2012-08-09 ENCOUNTER — Other Ambulatory Visit: Payer: Self-pay | Admitting: *Deleted

## 2012-08-09 MED ORDER — ENOXAPARIN SODIUM 120 MG/0.8ML ~~LOC~~ SOLN: 120.0000 mg | Freq: Every day | SUBCUTANEOUS | Status: DC

## 2012-08-09 NOTE — Telephone Encounter (Signed)
Recommendations regarding anti-coagulation noted. Will follow these recommendations

## 2012-08-09 NOTE — Telephone Encounter (Signed)
Called pt to inform of need for Lovenox bridge.  He is in agreement.  Has INR appt for 4/15 to get instructions for bridging.  Rx for Lovenox 120mg  sq daily as directed (#10 syringes) sent to [facility].

## 2012-08-09 NOTE — Telephone Encounter (Signed)
Per Dr Ladona Ridgel with prior TIA will need a Lovenox bridge.  Okay to hold Coumadin 5 days prior and ASA 2 days prior with the Lovenox bridge

## 2012-08-09 NOTE — Telephone Encounter (Signed)
Forwarded to Dr.Rehman. 

## 2012-08-16 NOTE — Telephone Encounter (Signed)
This was addressed on 4/1

## 2012-08-18 ENCOUNTER — Encounter (INDEPENDENT_AMBULATORY_CARE_PROVIDER_SITE_OTHER): Payer: Self-pay

## 2012-08-18 ENCOUNTER — Encounter (HOSPITAL_COMMUNITY): Payer: Self-pay | Admitting: Pharmacy Technician

## 2012-08-23 ENCOUNTER — Ambulatory Visit (INDEPENDENT_AMBULATORY_CARE_PROVIDER_SITE_OTHER): Payer: Medicare FFS | Admitting: *Deleted

## 2012-08-23 DIAGNOSIS — I4891 Unspecified atrial fibrillation: Secondary | ICD-10-CM

## 2012-08-23 DIAGNOSIS — Z7901 Long term (current) use of anticoagulants: Secondary | ICD-10-CM

## 2012-08-23 DIAGNOSIS — G459 Transient cerebral ischemic attack, unspecified: Secondary | ICD-10-CM

## 2012-08-23 MED ORDER — ENOXAPARIN SODIUM 120 MG/0.8ML ~~LOC~~ SOLN: 120.0000 mg | Freq: Every day | SUBCUTANEOUS | Status: DC

## 2012-08-23 NOTE — Patient Instructions (Signed)
4/15  Last dose coumadin 4/16  No Lovenox or coumadin 4/17  Lovenox 120mg  sq 8am 4/18  Lovenox 120mg  sq 8am 4/19  Lovenox 120mg  sq 8am 4/20  Lovenox 120mg  sq 8am 4/21  No lovenox----procedure----Warfarin 10mg  pm 4/22  Lovenox 120mg  sq 8am  &  Warfarin 10mg  pm 4/23  Lovenox 120mg  sq 8am  &  Warfarin 10mg  pm 4/24  Lovenox 120mg  sq 8am  &  Warfarin 10mg  pm 4/25  Lovenox 120mg  sq 8am  ---  INR appt 3:30pm  Enoxaparin 120mg  #10 x 1 refill called to [facility]

## 2012-08-24 ENCOUNTER — Telehealth: Payer: Self-pay | Admitting: *Deleted

## 2012-08-24 NOTE — Telephone Encounter (Signed)
Needs to make sure that we are setting up [facility] for Lovenox injections,. / tgs

## 2012-08-25 NOTE — Telephone Encounter (Signed)
Spoke with pt.  He is giving lovenox injections himself.  Gave lovenox this am without problem.

## 2012-08-29 ENCOUNTER — Telehealth: Payer: Self-pay | Admitting: Internal Medicine

## 2012-08-29 ENCOUNTER — Encounter (HOSPITAL_COMMUNITY): Payer: Self-pay | Admitting: *Deleted

## 2012-08-29 ENCOUNTER — Ambulatory Visit (HOSPITAL_COMMUNITY)
Admission: RE | Admit: 2012-08-29 | Discharge: 2012-08-29 | Disposition: A | Discharge status: Home / Self Care | Payer: Medicare FFS | Source: Ambulatory Visit | Attending: Internal Medicine | Admitting: Internal Medicine

## 2012-08-29 ENCOUNTER — Encounter (HOSPITAL_COMMUNITY)
Admission: RE | Disposition: A | Discharge status: Home / Self Care | Payer: Self-pay | Source: Ambulatory Visit | Attending: Internal Medicine

## 2012-08-29 DIAGNOSIS — Z8719 Personal history of other diseases of the digestive system: Secondary | ICD-10-CM

## 2012-08-29 DIAGNOSIS — I10 Essential (primary) hypertension: Secondary | ICD-10-CM | POA: Insufficient documentation

## 2012-08-29 DIAGNOSIS — R1310 Dysphagia, unspecified: Secondary | ICD-10-CM

## 2012-08-29 DIAGNOSIS — K222 Esophageal obstruction: Secondary | ICD-10-CM

## 2012-08-29 DIAGNOSIS — R1319 Other dysphagia: Secondary | ICD-10-CM

## 2012-08-29 DIAGNOSIS — K219 Gastro-esophageal reflux disease without esophagitis: Secondary | ICD-10-CM

## 2012-08-29 HISTORY — DX: Hypothyroidism, unspecified: E03.9

## 2012-08-29 HISTORY — DX: Nausea with vomiting, unspecified: R11.2

## 2012-08-29 HISTORY — PX: ESOPHAGOGASTRODUODENOSCOPY (EGD) WITH ESOPHAGEAL DILATION: SHX5812

## 2012-08-29 HISTORY — DX: Personal history of urinary calculi: Z87.442

## 2012-08-29 HISTORY — DX: Benign prostatic hyperplasia without lower urinary tract symptoms: N40.0

## 2012-08-29 HISTORY — DX: Other specified postprocedural states: Z98.890

## 2012-08-29 HISTORY — DX: Unspecified atrial fibrillation: I48.91

## 2012-08-29 SURGERY — ESOPHAGOGASTRODUODENOSCOPY (EGD) WITH ESOPHAGEAL DILATION
Anesthesia: Moderate Sedation

## 2012-08-29 MED ORDER — SODIUM CHLORIDE 0.9 % IV SOLN
INTRAVENOUS | Status: DC
  Administered 2012-08-29: 11:00:00 via INTRAVENOUS

## 2012-08-29 MED ORDER — MIDAZOLAM HCL 5 MG/5ML IJ SOLN
INTRAMUSCULAR | Status: AC
  Filled 2012-08-29: qty 10

## 2012-08-29 MED ORDER — STERILE WATER FOR IRRIGATION IR SOLN
Status: DC | PRN
  Administered 2012-08-29: 12:00:00

## 2012-08-29 MED ORDER — BUTAMBEN-TETRACAINE-BENZOCAINE 2-2-14 % EX AERO
INHALATION_SPRAY | CUTANEOUS | Status: DC | PRN
  Administered 2012-08-29: 2 via TOPICAL

## 2012-08-29 MED ORDER — MEPERIDINE HCL 25 MG/ML IJ SOLN
INTRAMUSCULAR | Status: DC | PRN
  Administered 2012-08-29: 25 mg via INTRAVENOUS

## 2012-08-29 MED ORDER — MEPERIDINE HCL 50 MG/ML IJ SOLN
INTRAMUSCULAR | Status: AC
  Filled 2012-08-29: qty 1

## 2012-08-29 MED ORDER — MIDAZOLAM HCL 5 MG/5ML IJ SOLN
INTRAMUSCULAR | Status: DC | PRN
  Administered 2012-08-29: 1 mg via INTRAVENOUS
  Administered 2012-08-29: 2 mg via INTRAVENOUS
  Administered 2012-08-29: 1 mg via INTRAVENOUS

## 2012-08-29 NOTE — Op Note (Addendum)
EGD PROCEDURE REPORT  PATIENT:  Danny Lucas  MR#:  [id number] Birthdate:  Mar 14, 1930, 79 y.o., male Endoscopist:  Dr. Malissa Hippo, MD Referred By:  Dr. Juliette Alcide, MD Procedure Date: 08/29/2012  Procedure:   EGD with ED(balloon).  Indications:  Patient is 79 year old Caucasian male with history of esophageal stricture and was last dilated in March 2010 who now presents with solid food dysphagia. His heartburn is well controlled with therapy. He is been bridged with Lovenox since he's been off his warfarin last dose was yesterday.            Informed Consent:  The risks, benefits, alternatives & imponderables which include, but are not limited to, bleeding, infection, perforation, drug reaction and potential missed lesion have been reviewed.  The potential for biopsy, lesion removal, esophageal dilation, etc. have also been discussed.  Questions have been answered.  All parties agreeable.  Please see history & physical in medical record for more information.  Medications:  Demerol 25 mg IV Versed 4 mg IV Cetacaine spray topically for oropharyngeal anesthesia  Description of procedure:  The endoscope was introduced through the mouth and advanced to the second portion of the duodenum without difficulty or limitations. The mucosal surfaces were surveyed very carefully during advancement of the scope and upon withdrawal.  Findings:  Esophagus:  Mucosa of the esophagus was normal. The ringlike stricture noted at GE junction. Some resistance noted as the scope was passed across it. GEJ:  38 cm Stomach:  Stomach was empty and distended very well insufflation. Folds in the proximal stomach are normal. Examination of mucosa at body was normal. Focal antral erythema noted without erosions or ulcers. Pyloric channel was patent. Angularis fundus and cardia were examined by retroflexing the scope and were normal. Duodenum:  Normal bulbar and post bulbar mucosa.  Therapeutic/Diagnostic  Maneuvers Performed:   Distal esophageal stricture was dilated with a balloon. Balloon dilator was passed of the scope. Guidewire was pushed into gastric lumen. Balloon dilator was positioned across the stricture of a drunk scope and the body of the esophagus. This stricture was dilated from 15 to16.5 and finally to 18 mm resulting in mucosal disruption. Balloon.was deflated withdrawn. Endoscope was passed across the stricture without any resistance.  Complications:  None  Impression: Stricture at GE junction without endoscopic changes of reflux esophagitis. Distal esophageal stricture was dilated with a balloon to 18 mm.  Recommendations:  Patient will resume warfarin at usual dose starting this evening and get INR checked in 7-10 days. Resume Lovenox starting this evening daily for 4 days. Call office with progress report in one week.  Danny Lucas,Danny Lucas  08/29/2012  12:24 PM  CC: Dr. Juliette Alcide, MD & Dr. Bonnetta Barry ref. provider found

## 2012-08-29 NOTE — Telephone Encounter (Signed)
Pt to follow coumadin and lovenox instructions as given.  Wife verbalized understanding.

## 2012-08-29 NOTE — Telephone Encounter (Signed)
HAS QUESTIONS ABOUT TAKING LOVANOX SHOTS TONIGHT. DR [facility] STATED TO TAKE IT DIFFERENT FROM THE INSTRUCTIONS THAT WERE GIVEN ORGINALLY

## 2012-08-29 NOTE — H&P (Signed)
Danny Lucas is an 79 y.o. male.   Chief Complaint: Patient is here for EGD and ED. HPI: Patient is 79 year old Caucasian male who presents for solid food dysphagia. He has history of chronic GERD and esophageal stricture which was last dilated in March 2010 at [facility] in [place]. Excellent resultant a few months ago. Patient's warfarin is on hold and he's been bridged with Lovenox.   Past Medical History  Diagnosis Date  . Carotid artery occlusion     carotid artery stenosis  . Transient ischemic attack   . Hypertension   . Myocardial infarction   . GERD (gastroesophageal reflux disease)   . Dysphagia   . BPH (benign prostatic hypertrophy)   . Hypothyroidism   . PONV (postoperative nausea and vomiting)   . History of kidney stones   . Atrial fibrillation     Past Surgical History  Procedure Laterality Date  . Coronary artery bypass graft      coronary artery bypass grafting time 6 with left internal mammery to ythe left anterior descending coronary artery ,reverse saphenous vein graft to the intermediate coronary artery sequential reverse saphenous vein graft to the first obtuse marginal and distal circumflex,sequential reverse saphenous vein graft to the acute  marginal and posterior descending coronary artery branch of the right corh  . Cholecystectomy    . Colonoscopy    . Upper gastrointestinal endoscopy    . Hernia repair Right 2006    [facility]  . Back surgery  2005    [facility] in [place], [place]    History reviewed. No pertinent family history. Social History:  reports that he has quit smoking. His smoking use included Cigarettes. He has a 90 pack-year smoking history. He has never used smokeless tobacco. He reports that he does not drink alcohol or use illicit drugs.  Allergies:  Allergies  Allergen Reactions  . Amiodarone Hcl     Loose strength in legs.    Medications Prior to Admission  Medication Sig Dispense Refill  . aspirin 81 MG tablet  Take 81 mg by mouth daily.        Marland Kitchen doxazosin (CARDURA) 4 MG tablet Take 4 mg by mouth at bedtime.        . enoxaparin (LOVENOX) 120 MG/0.8ML injection Inject 0.8 mLs (120 mg total) into the skin daily.  10 Syringe  1  . finasteride (PROSCAR) 5 MG tablet Take 5 mg by mouth daily.       Marland Kitchen levothyroxine (SYNTHROID, LEVOTHROID) 50 MCG tablet Take 50 mcg by mouth daily.      . metoprolol (LOPRESSOR) 50 MG tablet Take 50 mg by mouth 2 (two) times daily.        . pantoprazole (PROTONIX) 40 MG tablet Take 1 tablet (40 mg total) by mouth daily.  90 tablet  3  . warfarin (COUMADIN) 5 MG tablet Take 1 tablet (5 mg total) by mouth daily.  45 tablet  3  . fluticasone (FLONASE) 50 MCG/ACT nasal spray Place 1 spray into the nose daily as needed for allergies.         No results found for this or any previous visit (from the past 48 hour(s)). No results found.  ROS  Blood pressure 152/83, temperature 97.4 F (36.3 C), temperature source Oral, resp. rate 17, height 5\' 9"  (1.753 m), weight 173 lb (78.472 kg), SpO2 93.00%. Physical Exam  Constitutional: He appears well-developed and well-nourished.  HENT:  Mouth/Throat: Oropharynx is clear and moist.  Eyes: Conjunctivae are normal. No scleral icterus.  Neck: Neck supple. No thyromegaly present.  Cardiovascular: Normal rate, regular rhythm and normal heart sounds.   No murmur heard. Respiratory: Effort normal and breath sounds normal.  GI: Soft. He exhibits no mass. There is no tenderness.  Musculoskeletal: He exhibits edema (trace edema around ankles).  Lymphadenopathy:    He has no cervical adenopathy.  Neurological: He is alert.  Skin: Skin is warm and dry.     Assessment/Plan Solid food dysphagia in a patient with history of esophageal stricture. EGD with ED.  Danny Lucas 08/29/2012, 11:54 AM

## 2012-08-30 ENCOUNTER — Encounter (HOSPITAL_COMMUNITY): Payer: Self-pay | Admitting: Internal Medicine

## 2012-09-02 ENCOUNTER — Ambulatory Visit (INDEPENDENT_AMBULATORY_CARE_PROVIDER_SITE_OTHER): Payer: Medicare FFS | Admitting: *Deleted

## 2012-09-02 DIAGNOSIS — I4891 Unspecified atrial fibrillation: Secondary | ICD-10-CM

## 2012-09-02 DIAGNOSIS — G459 Transient cerebral ischemic attack, unspecified: Secondary | ICD-10-CM

## 2012-09-02 DIAGNOSIS — Z7901 Long term (current) use of anticoagulants: Secondary | ICD-10-CM

## 2012-09-20 ENCOUNTER — Ambulatory Visit (INDEPENDENT_AMBULATORY_CARE_PROVIDER_SITE_OTHER): Payer: Medicare FFS | Admitting: *Deleted

## 2012-09-20 DIAGNOSIS — I4891 Unspecified atrial fibrillation: Secondary | ICD-10-CM

## 2012-09-20 DIAGNOSIS — Z7901 Long term (current) use of anticoagulants: Secondary | ICD-10-CM

## 2012-09-20 DIAGNOSIS — G459 Transient cerebral ischemic attack, unspecified: Secondary | ICD-10-CM

## 2012-10-18 ENCOUNTER — Ambulatory Visit (INDEPENDENT_AMBULATORY_CARE_PROVIDER_SITE_OTHER): Payer: Medicare FFS | Admitting: *Deleted

## 2012-10-18 DIAGNOSIS — G459 Transient cerebral ischemic attack, unspecified: Secondary | ICD-10-CM

## 2012-10-18 DIAGNOSIS — Z7901 Long term (current) use of anticoagulants: Secondary | ICD-10-CM

## 2012-10-18 DIAGNOSIS — I4891 Unspecified atrial fibrillation: Secondary | ICD-10-CM

## 2012-10-31 ENCOUNTER — Telehealth (INDEPENDENT_AMBULATORY_CARE_PROVIDER_SITE_OTHER): Payer: Self-pay | Admitting: *Deleted

## 2012-10-31 NOTE — Telephone Encounter (Signed)
[name] left a message on Friday asking if he was to stay on the Pantoprazole or could he come off of it. In reviewing the office note from March , it read that the patient needed to be on the medication for hisDysphagia. I called [name] and advised him of this and told him that once Dr.Rehman was back from vacation I would address this with him and give him a call back.

## 2012-11-15 ENCOUNTER — Ambulatory Visit (INDEPENDENT_AMBULATORY_CARE_PROVIDER_SITE_OTHER): Payer: Medicare FFS | Admitting: *Deleted

## 2012-11-15 DIAGNOSIS — I4891 Unspecified atrial fibrillation: Secondary | ICD-10-CM

## 2012-11-15 DIAGNOSIS — Z7901 Long term (current) use of anticoagulants: Secondary | ICD-10-CM

## 2012-11-15 DIAGNOSIS — G459 Transient cerebral ischemic attack, unspecified: Secondary | ICD-10-CM

## 2012-11-15 LAB — POCT INR: INR: 2.1

## 2012-12-20 ENCOUNTER — Ambulatory Visit (INDEPENDENT_AMBULATORY_CARE_PROVIDER_SITE_OTHER): Payer: Medicare FFS | Admitting: *Deleted

## 2012-12-20 DIAGNOSIS — Z7901 Long term (current) use of anticoagulants: Secondary | ICD-10-CM

## 2012-12-20 DIAGNOSIS — G459 Transient cerebral ischemic attack, unspecified: Secondary | ICD-10-CM

## 2012-12-20 DIAGNOSIS — I4891 Unspecified atrial fibrillation: Secondary | ICD-10-CM

## 2012-12-20 LAB — POCT INR: INR: 2.3

## 2013-01-02 ENCOUNTER — Telehealth: Payer: Self-pay | Admitting: Internal Medicine

## 2013-01-02 MED ORDER — WARFARIN SODIUM 5 MG PO TABS: 5.0000 mg | ORAL_TABLET | Freq: Every day | ORAL | Status: DC

## 2013-01-02 NOTE — Telephone Encounter (Signed)
Medication sent via escribe.  

## 2013-01-02 NOTE — Telephone Encounter (Signed)
Needs Coumadin refill sent to [facility] / tgs

## 2013-02-07 ENCOUNTER — Ambulatory Visit (INDEPENDENT_AMBULATORY_CARE_PROVIDER_SITE_OTHER): Payer: Medicare FFS | Admitting: *Deleted

## 2013-02-07 DIAGNOSIS — G459 Transient cerebral ischemic attack, unspecified: Secondary | ICD-10-CM

## 2013-02-07 DIAGNOSIS — Z7901 Long term (current) use of anticoagulants: Secondary | ICD-10-CM

## 2013-02-07 DIAGNOSIS — I4891 Unspecified atrial fibrillation: Secondary | ICD-10-CM

## 2013-02-09 ENCOUNTER — Telehealth (INDEPENDENT_AMBULATORY_CARE_PROVIDER_SITE_OTHER): Payer: Self-pay | Admitting: *Deleted

## 2013-02-09 NOTE — Telephone Encounter (Signed)
Patient was given RX for pantoprazole in April 2014 when you done EGD/ED -- patent states he was given the medicine for heartburn but he doesn't have heartburn -- patient wants to know if he needs to take it, states he has been taking it since the procedure -- please advise

## 2013-02-09 NOTE — Telephone Encounter (Signed)
He has stricture secondary to GERD. In my opinion he should continue pantoprazole if it does not make sense to him, he can discontinue this medication. Please call patient

## 2013-02-10 NOTE — Telephone Encounter (Signed)
Patient called and told Dr.Rehman's recommendation. He states that he will continue the medication.

## 2013-03-21 ENCOUNTER — Ambulatory Visit (INDEPENDENT_AMBULATORY_CARE_PROVIDER_SITE_OTHER): Payer: Medicare FFS | Admitting: *Deleted

## 2013-03-21 DIAGNOSIS — I4891 Unspecified atrial fibrillation: Secondary | ICD-10-CM

## 2013-03-21 DIAGNOSIS — G459 Transient cerebral ischemic attack, unspecified: Secondary | ICD-10-CM

## 2013-03-21 DIAGNOSIS — Z7901 Long term (current) use of anticoagulants: Secondary | ICD-10-CM

## 2013-03-21 LAB — POCT INR: INR: 2.3

## 2013-05-02 ENCOUNTER — Ambulatory Visit (INDEPENDENT_AMBULATORY_CARE_PROVIDER_SITE_OTHER): Payer: Medicare FFS | Admitting: Pharmacist

## 2013-05-02 DIAGNOSIS — I4891 Unspecified atrial fibrillation: Secondary | ICD-10-CM

## 2013-05-02 DIAGNOSIS — G459 Transient cerebral ischemic attack, unspecified: Secondary | ICD-10-CM

## 2013-05-02 DIAGNOSIS — Z7901 Long term (current) use of anticoagulants: Secondary | ICD-10-CM

## 2013-05-02 LAB — POCT INR: INR: 2.3

## 2013-06-07 ENCOUNTER — Institutional Professional Consult (permissible substitution): Payer: Medicare PPO | Admitting: Neurology

## 2013-06-13 ENCOUNTER — Ambulatory Visit (INDEPENDENT_AMBULATORY_CARE_PROVIDER_SITE_OTHER): Payer: Medicare FFS | Admitting: *Deleted

## 2013-06-13 DIAGNOSIS — Z7901 Long term (current) use of anticoagulants: Secondary | ICD-10-CM

## 2013-06-13 DIAGNOSIS — I4891 Unspecified atrial fibrillation: Secondary | ICD-10-CM

## 2013-06-13 DIAGNOSIS — G459 Transient cerebral ischemic attack, unspecified: Secondary | ICD-10-CM

## 2013-06-13 DIAGNOSIS — Z5181 Encounter for therapeutic drug level monitoring: Secondary | ICD-10-CM

## 2013-06-13 LAB — POCT INR: INR: 2.1

## 2013-06-28 ENCOUNTER — Institutional Professional Consult (permissible substitution): Payer: Medicare PPO | Admitting: Neurology

## 2013-07-12 ENCOUNTER — Institutional Professional Consult (permissible substitution): Payer: Medicare PPO | Admitting: Neurology

## 2013-07-17 ENCOUNTER — Encounter: Payer: Self-pay | Admitting: Internal Medicine

## 2013-07-17 ENCOUNTER — Ambulatory Visit (INDEPENDENT_AMBULATORY_CARE_PROVIDER_SITE_OTHER): Payer: Medicare FFS | Admitting: Internal Medicine

## 2013-07-17 VITALS — BP 134/68 | HR 74 | Ht 69.0 in | Wt 182.0 lb

## 2013-07-17 DIAGNOSIS — I10 Essential (primary) hypertension: Secondary | ICD-10-CM

## 2013-07-17 DIAGNOSIS — I4891 Unspecified atrial fibrillation: Secondary | ICD-10-CM

## 2013-07-17 NOTE — Assessment & Plan Note (Signed)
He has had some orthostatic symptoms but is on both Cardura and Proscar. I have asked him to continue his current meds. He is instructed to get up slowly. He will discuss the possiblity of stopping one of his prostate meds with his urologist or primary MD.

## 2013-07-17 NOTE — Progress Notes (Signed)
HPI Danny Lucas returns today for followup. He is a very pleasant 80 year old man with paroxysmal/persistent atrial fibrillation, coronary artery disease, history of TIAs, status post bypass surgery, with hypertension, and dyslipidemia.  He has previously not wanted to take systemic anticoagulation but reluctantly agreed to do s a year ago. He denies chest pain, shortness of breath, or peripheral edema. Minimal palpitations are noted. He notes some orthostatic symptoms but has otherwise been stable.  Allergies  Allergen Reactions  . Amiodarone Hcl     Loose strength in legs.     Current Outpatient Prescriptions  Medication Sig Dispense Refill  . doxazosin (CARDURA) 4 MG tablet Take 4 mg by mouth at bedtime.        . finasteride (PROSCAR) 5 MG tablet Take 5 mg by mouth daily.       . fluticasone (FLONASE) 50 MCG/ACT nasal spray Place 1 spray into the nose daily as needed for allergies.       Marland Kitchen levothyroxine (SYNTHROID, LEVOTHROID) 50 MCG tablet Take 50 mcg by mouth daily.      . metoprolol (LOPRESSOR) 50 MG tablet Take 50 mg by mouth 2 (two) times daily.        . pantoprazole (PROTONIX) 40 MG tablet Take 40 mg by mouth daily as needed.      . warfarin (COUMADIN) 5 MG tablet Take 1 tablet (5 mg total) by mouth daily.  45 tablet  3   No current facility-administered medications for this visit.     Past Medical History  Diagnosis Date  . Carotid artery occlusion     carotid artery stenosis  . Transient ischemic attack   . Hypertension   . Myocardial infarction   . GERD (gastroesophageal reflux disease)   . Dysphagia   . BPH (benign prostatic hypertrophy)   . Hypothyroidism   . PONV (postoperative nausea and vomiting)   . History of kidney stones   . Atrial fibrillation     ROS:   All systems reviewed and negative except as noted in the HPI.   Past Surgical History  Procedure Laterality Date  . Coronary artery bypass graft      coronary artery bypass grafting time 6 with left  internal mammery to ythe left anterior descending coronary artery ,reverse saphenous vein graft to the intermediate coronary artery sequential reverse saphenous vein graft to the first obtuse marginal and distal circumflex,sequential reverse saphenous vein graft to the acute  marginal and posterior descending coronary artery branch of the right corh  . Cholecystectomy    . Colonoscopy    . Upper gastrointestinal endoscopy    . Hernia repair Right 2006    [facility]  . Back surgery  2005    [facility] in [place], [place]  . Esophagogastroduodenoscopy (egd) with esophageal dilation N/A 08/29/2012    Procedure: ESOPHAGOGASTRODUODENOSCOPY (EGD) WITH ESOPHAGEAL DILATION;  Surgeon: Malissa Hippo, MD;  Location: [facility];  Service: Endoscopy;  Laterality: N/A;  1200     History reviewed. No pertinent family history.   History   Social History  . Marital Status: Married    Spouse Name: N/A    Number of Children: N/A  . Years of Education: N/A   Occupational History  . Not on file.   Social History Main Topics  . Smoking status: Former Smoker -- 3.00 packs/day for 30 years    Types: Cigarettes  . Smokeless tobacco: Never Used     Comment: Patient quit smoking in 1962  .  Alcohol Use: No  . Drug Use: No  . Sexual Activity: Not on file   Other Topics Concern  . Not on file   Social History Narrative  . No narrative on file     BP 134/68  Pulse 74  Ht 5\' 9"  (1.753 m)  Wt 182 lb (82.555 kg)  BMI 26.86 kg/m2  Physical Exam:  Well appearing 80 year old man,NAD HEENT: Unremarkable Neck:  7 cm JVD, no thyromegally Lungs:  Clear with no wheezes, rales, or rhonchi. HEART:  IRegular rate rhythm, no murmurs, no rubs, no clicks Abd:  soft, positive bowel sounds, no organomegally, no rebound, no guarding Ext:  2 plus pulses, no edema, no cyanosis, no clubbing Skin:  No rashes no nodules Neuro:  CN II through XII intact, motor grossly intact  DEVICE  Normal device  function.  See PaceArt for details.   Assess/Plan:

## 2013-07-17 NOTE — Patient Instructions (Signed)
Your physician recommends that you schedule a follow-up appointment in: 1 year with Dr Taylor You will receive a reminder letter two months in advance reminding you to call and schedule your appointment. If you don't receive this letter, please contact our office.  Your physician recommends that you continue on your current medications as directed. Please refer to the Current Medication list given to you today.   

## 2013-07-17 NOTE — Assessment & Plan Note (Signed)
He appears to be maintaining NSR on beta blocker therapy. He will continue anti-coagulation.

## 2013-07-31 ENCOUNTER — Encounter: Payer: Self-pay | Admitting: Neurology

## 2013-07-31 ENCOUNTER — Ambulatory Visit (INDEPENDENT_AMBULATORY_CARE_PROVIDER_SITE_OTHER): Payer: Medicare PPO | Admitting: Neurology

## 2013-07-31 VITALS — BP 122/80 | HR 73 | Ht 69.0 in | Wt 180.0 lb

## 2013-07-31 DIAGNOSIS — M48061 Spinal stenosis, lumbar region without neurogenic claudication: Secondary | ICD-10-CM | POA: Insufficient documentation

## 2013-07-31 NOTE — Progress Notes (Signed)
PATIENT: Danny Lucas DOB: 1929-10-17  HISTORICAL  RAJENDRA SPILLER is a 80 year old right-handed Caucasian male, accompanied by his wife at today's clinical visit. I saw him previously in 2012 for mild memory trouble gait difficulty, he came in today, with worsening gait difficulty,  He has past medical history of diabetes, boarderline, essential hypertension, coronary artery disease, status post heart surgery, also had a history of lumbar spinal stenosis, status post decompression in 2006, he no longer has significant low back pain, but has right foot drop, gait difficulty at baseline.  He had open heart surgery February 2011, was noted to be in atrial fibrillation was treated with amiodarone 200 mg every day, he reported worsening bilateral feet discomfort with amiodarone treatment, before treatment, he felt bilateral foot swelling sensation, with amiodarone treatment, he felt more discomfort, right worse than left, he denies incontinence, he denies bilateral upper extremity involvement,  Wife also noticed mild memory trouble, he has good days and bad days some days he is confused,  EMG/NCS study in 2012,   has demonstrated widespread chronic neuropathic changes involving bilateral L4, L5, S1 myotomes, consistent with  lumbar spinal stenosis. I have suggested MRI of the lumbar, but he refused then. In addition there is evidence of length-dependent mild axonal peripheral neuropathy, patient stated his bilateral lower extremity symptoms has made worse by amiodarone use, which stopped 2 months ago, amiodarone is notoriously for causing peripheral neuropathy, his symptoms have since improved since he stopped amiodarone, he can ambulate better, I again emphasized the importance of the safety, encourage him to use right AFO   We have reviewed MRI of the brain from 2010,  CAT scan of the brain from March 2012, there is evidence of mild to moderate  periventricular white matter disease, no acute  lesions .  laboratory evaluation also  demonstrated significantly elevated TSH, he was seen by his primary care physician, now on thyroid supplements.   He stills drives short distance, his memory trouble has mild worsening,  the most bothersome symptoms is worsening gait difficulty, his balance has much worsened since 02/2013,  he has difficulty standing still, he tends to fall forwards,he only has mild low back pain when bending over,   He has good appetite, sleeping well.  He has difficulty to start urination, mild constipation.    He has fell few time, tends to lean again things.   REVIEW OF SYSTEMS: Full 14 system review of systems performed and notable only for fatigue, trouble swallowing, cough, importance, easy bleeding, feeling cold, weakness, slurred speech, difficulty swallowing, dizziness  ALLERGIES: Allergies  Allergen Reactions  . Amiodarone Hcl     Loose strength in legs.    HOME MEDICATIONS: Current Outpatient Prescriptions on File Prior to Visit  Medication Sig Dispense Refill  . doxazosin (CARDURA) 4 MG tablet Take 4 mg by mouth at bedtime.        . finasteride (PROSCAR) 5 MG tablet Take 5 mg by mouth daily.       . fluticasone (FLONASE) 50 MCG/ACT nasal spray Place 1 spray into the nose daily as needed for allergies.       Marland Kitchen levothyroxine (SYNTHROID, LEVOTHROID) 50 MCG tablet Take 50 mcg by mouth daily.      . metoprolol (LOPRESSOR) 50 MG tablet Take 50 mg by mouth 2 (two) times daily.        . pantoprazole (PROTONIX) 40 MG tablet Take 40 mg by mouth daily as needed.      Marland Kitchen  warfarin (COUMADIN) 5 MG tablet Take 1 tablet (5 mg total) by mouth daily.  45 tablet  3   No current facility-administered medications on file prior to visit.    PAST MEDICAL HISTORY: Past Medical History  Diagnosis Date  . Carotid artery occlusion     carotid artery stenosis  . Transient ischemic attack   . Hypertension   . Myocardial infarction   . GERD (gastroesophageal reflux  disease)   . Dysphagia   . BPH (benign prostatic hypertrophy)   . Hypothyroidism   . PONV (postoperative nausea and vomiting)   . History of kidney stones   . Atrial fibrillation     PAST SURGICAL HISTORY: Past Surgical History  Procedure Laterality Date  . Coronary artery bypass graft      coronary artery bypass grafting time 6 with left internal mammery to ythe left anterior descending coronary artery ,reverse saphenous vein graft to the intermediate coronary artery sequential reverse saphenous vein graft to the first obtuse marginal and distal circumflex,sequential reverse saphenous vein graft to the acute  marginal and posterior descending coronary artery branch of the right corh  . Cholecystectomy    . Colonoscopy    . Upper gastrointestinal endoscopy    . Hernia repair Right 2006    [facility]  . Back surgery  2005    [facility] in [place], [place]  . Esophagogastroduodenoscopy (egd) with esophageal dilation N/A 08/29/2012    Procedure: ESOPHAGOGASTRODUODENOSCOPY (EGD) WITH ESOPHAGEAL DILATION;  Surgeon: Rogene Houston, MD;  [facility];  Service: Endoscopy;  Laterality: N/A;  1200    FAMILY HISTORY: Family History  Problem Relation Age of Onset  . Heart Problems Mother     SOCIAL HISTORY:  History   Social History  . Marital Status: Married    Spouse Name: Tye Maryland    Number of Children: 2  . Years of Education: 7 th   Occupational History  .      retired   Social History Main Topics  . Smoking status: Former Smoker -- 3.00 packs/day for 30 years    Types: Cigarettes  . Smokeless tobacco: Never Used     Comment: Patient quit smoking in 1962  . Alcohol Use: No  . Drug Use: No  . Sexual Activity: Not on file   Other Topics Concern  . Not on file   Social History Narrative   Patient  Lives at home with his wife Tye [place])   Retired   Education 7 th grade   Right handed   Caffeine none     PHYSICAL EXAM   Filed Vitals:   07/31/13 1321    BP: 122/80  Pulse: 73  Height: _0  (1.753 m)  Weight: 180 lb (81.647 kg)    Not recorded    Body mass index is 26.57 kg/(m^2).   Generalized: In no acute distress  Neck: Supple, no carotid bruits   Cardiac: Regular rate rhythm  Pulmonary: Clear to auscultation bilaterally  Musculoskeletal: No deformity  Neurological examination  Mentation: Alert oriented to time, place, history taking, and causual conversation, Mini mental status examination is 25 out of 30, He is not oriented to month, could not spell worlds backwards  Cranial nerve II-XII: Pupils were equal round reactive to light. Extraocular movements were full.  Visual field were full on confrontational test. Bilateral fundi were sharp.  Facial sensation and strength were normal. Hearing was intact to finger rubbing bilaterally. Uvula tongue midline.  Head turning and shoulder  shrug and were normal and symmetric.Tongue protrusion into cheek strength was normal.  Motor: Bilateral ankle dorsiflexion 4/4 plus ankle plantar flexion 4/4 plus, mild bilateral hip flexion weakness, 4/4, knee flexion 4/4 plus knee extension 5 5  Sensory: Length dependent decreased touch, pinprick to mid shin, vibratory sensation was present at bilateral knee level  Coordination: Normal finger to nose, heel-to-shin bilaterally there was no truncal ataxia  Gait: Need to push on chair arm to get up from seated position, bilateral foot drop, right worse than left, very unsteady gait,  Romberg signs: positive  Deep tendon reflexes: Brachioradialis 2/2, biceps 2/2, triceps 2/2, patellar absent, Achilles absent, plantar responses were flexor bilaterally.   DIAGNOSTIC DATA (LABS, IMAGING, TESTING) - I reviewed patient records, labs, notes, testing and imaging myself where available.  Lab Results  Component Value Date   WBC 9.1 06/30/2009   HGB 11.1* 06/30/2009   HCT 32.0* 06/30/2009   MCV 90.2 06/30/2009   PLT 250 06/30/2009      Component  Value Date/Time   NA 143 06/30/2009 0344   K 4.0 06/30/2009 0344   CL 107 06/30/2009 0344   CO2 30 06/30/2009 0344   GLUCOSE 119* 06/30/2009 0344   BUN 18 06/30/2009 0344   CREATININE 1.42 06/30/2009 0344   CALCIUM 8.2* 06/30/2009 0344   PROT 5.9* 06/23/2009 0400   ALBUMIN 2.8* 06/23/2009 0400   AST 33 06/23/2009 0400   ALT 46 06/23/2009 0400   ALKPHOS 55 06/23/2009 0400   BILITOT 1.0 06/23/2009 0400   GFRNONAA 48* 06/30/2009 0344   GFRAA  Value: 58        The eGFR has been calculated using the MDRD equation. This calculation has not been validated in all clinical situations. eGFR's persistently <60 mL/min signify possible Chronic Kidney Disease.* 06/30/2009 0344   Lab Results  Component Value Date   CHOL  Value: 130        ATP III CLASSIFICATION:  <200     mg/dL   Desirable  200-239  mg/dL   Borderline High  >=240    mg/dL   High        06/19/2009   HDL 35* 06/19/2009   LDLCALC  Value: 82        Total Cholesterol/HDL:CHD Risk Coronary Heart Disease Risk Table                     Men   Women  1/2 Average Risk   3.4   3.3  Average Risk       5.0   4.4  2 X Average Risk   9.6   7.1  3 X Average Risk  23.4   11.0        Use the calculated Patient Ratio above and the CHD Risk Table to determine the patient's CHD Risk.        ATP III CLASSIFICATION (LDL):  <100     mg/dL   Optimal  100-129  mg/dL   Near or Above                    Optimal  130-159  mg/dL   Borderline  160-189  mg/dL   High  >190     mg/dL   Very High 06/19/2009   TRIG 63 06/19/2009   CHOLHDL 3.7 06/19/2009   Lab Results  Component Value Date   HGBA1C  Value: 5.8 (NOTE) The ADA recommends the following therapeutic goal for glycemic control related to  Hgb A1c measurement: Goal of therapy: <6.5 Hgb A1c  Reference: American Diabetes Association: Clinical Practice Recommendations 2010, Diabetes Care, 2010, 33: (Suppl  1). 06/19/2009   ASSESSMENT AND PLAN  JADARIOUS DOBBINS is a 80 y.o. male complains of worsening gait difficulty, most suggestive of lumbar  sacral stenosis, 1. Home physical therapy 2. MRI of lumbar    Marcial Pacas, M.D. Ph.D.  [facility] [address], [place], [place] [address] [phone]

## 2013-08-01 ENCOUNTER — Ambulatory Visit (INDEPENDENT_AMBULATORY_CARE_PROVIDER_SITE_OTHER): Payer: Medicare FFS | Admitting: *Deleted

## 2013-08-01 DIAGNOSIS — G459 Transient cerebral ischemic attack, unspecified: Secondary | ICD-10-CM

## 2013-08-01 DIAGNOSIS — I4891 Unspecified atrial fibrillation: Secondary | ICD-10-CM

## 2013-08-01 DIAGNOSIS — Z5181 Encounter for therapeutic drug level monitoring: Secondary | ICD-10-CM

## 2013-08-01 LAB — POCT INR: INR: 2.2

## 2013-08-10 ENCOUNTER — Telehealth: Payer: Self-pay | Admitting: Neurology

## 2013-08-10 NOTE — Telephone Encounter (Signed)
I called patient. The MRI of the lumbosacral spine showed some multilevel degenerative changes, with possible neuroforaminal stenosis at the L3-4 level on the left possibly impinging the left L3 nerve root. There is also a broad-based disc protrusion at the L4-5 level potentially affecting the L4 and L5 nerve roots bilaterally. No evidence of spinal stenosis is seen. Unless the patient is having a lot of pain in the low back for down the legs, I'm not sure that this study needs to be acted upon. No evidence of anything that would significantly impact balance or walking.

## 2013-08-31 ENCOUNTER — Encounter: Payer: Self-pay | Admitting: Neurology

## 2013-09-04 ENCOUNTER — Encounter (INDEPENDENT_AMBULATORY_CARE_PROVIDER_SITE_OTHER): Payer: Self-pay

## 2013-09-04 ENCOUNTER — Encounter: Payer: Self-pay | Admitting: Neurology

## 2013-09-04 ENCOUNTER — Ambulatory Visit (INDEPENDENT_AMBULATORY_CARE_PROVIDER_SITE_OTHER): Payer: Medicare PPO | Admitting: Neurology

## 2013-09-04 VITALS — BP 145/92 | HR 68 | Ht 69.0 in | Wt 171.0 lb

## 2013-09-04 DIAGNOSIS — R269 Unspecified abnormalities of gait and mobility: Secondary | ICD-10-CM

## 2013-09-04 DIAGNOSIS — M48061 Spinal stenosis, lumbar region without neurogenic claudication: Secondary | ICD-10-CM

## 2013-09-04 DIAGNOSIS — I219 Acute myocardial infarction, unspecified: Secondary | ICD-10-CM

## 2013-09-04 DIAGNOSIS — G459 Transient cerebral ischemic attack, unspecified: Secondary | ICD-10-CM

## 2013-09-04 NOTE — Progress Notes (Signed)
  PATIENT: Danny Lucas DOB: 10/11/1929  HISTORICAL  Danny Lucas is a 80-year-old right-handed Caucasian male, accompanied by his wife at today's clinical visit. I saw him previously in 2012 for mild memory trouble gait difficulty, he came in today, with worsening gait difficulty,  He has past medical history of diabetes, boarderline, essential hypertension, coronary artery disease, status post heart surgery, also had a history of lumbar spinal stenosis, status post decompression in 2006, he no longer has significant low back pain, but has right foot drop, gait difficulty at baseline.  He had open heart surgery February 2011, was noted to be in atrial fibrillation was treated with amiodarone 200 mg every day, he reported worsening bilateral feet discomfort with amiodarone treatment, before treatment, he felt bilateral foot swelling sensation, with amiodarone treatment, he felt more discomfort, right worse than left, he denies incontinence, he denies bilateral upper extremity involvement,  Wife also noticed mild memory trouble, he has good days and bad days some days he is confused,  EMG/NCS study in 2012,   has demonstrated widespread chronic neuropathic changes involving bilateral L4, L5, S1 myotomes, consistent with  lumbar spinal stenosis. I have suggested MRI of the lumbar, but he refused then. In addition there is evidence of length-dependent mild axonal peripheral neuropathy, patient stated his bilateral lower extremity symptoms has made worse by amiodarone use, which stopped 2 months ago, amiodarone is notoriously for causing peripheral neuropathy, his symptoms have since improved since he stopped amiodarone, he can ambulate better, I again emphasized the importance of the safety, encourage him to use right AFO   We have reviewed MRI of the brain from 2010,  CAT scan of the brain from March 2012, there is evidence of mild to moderate  periventricular white matter disease, no acute  lesions .  laboratory evaluation also  demonstrated significantly elevated TSH, he was seen by his primary care physician, now on thyroid supplements.   He stills drives short distance, his memory trouble has mild worsening,  the most bothersome symptoms is worsening gait difficulty, his balance has much worsened since 02/2013,  he has difficulty standing still, he tends to fall forwards,he only has mild low back pain when bending over,   He has good appetite, sleeping well.  He has difficulty to start urination, mild constipation.    He has fell few time, tends to lean again things  UPDATE April 27th 2015: He continued to have worsening gait difficulty, also occasionally bowel incontinence over past one year,   We have reviewed the MRI of lumbar spine from [facility] in March 30th  2015,there was  Multilevel degenerative disc disease, L3 and 4 left foraminal osteophyte, possible the local irradiation to her left L3 nerve roots, L4-5, broad based disc material with bilateral lateral recess foraminal narrowing, potentially affect L4 and V nerve roots, L5-S1, facet degeneration, without stenosis, or neural compression, no central canal stenosis.   REVIEW OF SYSTEMS: Full 14 system review of systems performed and notable only for fatigue, trouble swallowing, cough, importance, easy bleeding, feeling cold, weakness, slurred speech, difficulty swallowing, dizziness  ALLERGIES: Allergies  Allergen Reactions  . Amiodarone Hcl     Loose strength in legs.    HOME MEDICATIONS: Current Outpatient Prescriptions on File Prior to Visit  Medication Sig Dispense Refill  . doxazosin (CARDURA) 4 MG tablet Take 4 mg by mouth at bedtime.        . finasteride (PROSCAR) 5 MG tablet Take 5 mg by mouth   daily.       . fluticasone (FLONASE) 50 MCG/ACT nasal spray Place 1 spray into the nose daily as needed for allergies.       Marland Kitchen levothyroxine (SYNTHROID, LEVOTHROID) 50 MCG tablet Take 50 mcg by mouth  daily.      . metoprolol (LOPRESSOR) 50 MG tablet Take 50 mg by mouth 2 (two) times daily.        . pantoprazole (PROTONIX) 40 MG tablet Take 40 mg by mouth daily as needed.      . warfarin (COUMADIN) 5 MG tablet Take 1 tablet (5 mg total) by mouth daily.  45 tablet  3   No current facility-administered medications on file prior to visit.    PAST MEDICAL HISTORY: Past Medical History  Diagnosis Date  . Carotid artery occlusion     carotid artery stenosis  . Transient ischemic attack   . Hypertension   . Myocardial infarction   . GERD (gastroesophageal reflux disease)   . Dysphagia   . BPH (benign prostatic hypertrophy)   . Hypothyroidism   . PONV (postoperative nausea and vomiting)   . History of kidney stones   . Atrial fibrillation     PAST SURGICAL HISTORY: Past Surgical History  Procedure Laterality Date  . Coronary artery bypass graft      coronary artery bypass grafting time 6 with left internal mammery to ythe left anterior descending coronary artery ,reverse saphenous vein graft to the intermediate coronary artery sequential reverse saphenous vein graft to the first obtuse marginal and distal circumflex,sequential reverse saphenous vein graft to the acute  marginal and posterior descending coronary artery branch of the right corh  . Cholecystectomy    . Colonoscopy    . Upper gastrointestinal endoscopy    . Hernia repair Right 2006    [facility]  . Back surgery  2005    [facility] in [place], [place]  . Esophagogastroduodenoscopy (egd) with esophageal dilation N/A 08/29/2012    Procedure: ESOPHAGOGASTRODUODENOSCOPY (EGD) WITH ESOPHAGEAL DILATION;  Surgeon: Rogene Houston, MD;  [facility];  Service: Endoscopy;  Laterality: N/A;  1200    FAMILY HISTORY: Family History  Problem Relation Age of Onset  . Heart Problems Mother     SOCIAL HISTORY:  History   Social History  . Marital Status: Married    Spouse Name: Danny Lucas    Number of Children: 2   . Years of Education: 7 th   Occupational History  .      retired   Social History Main Topics  . Smoking status: Former Smoker -- 3.00 packs/day for 30 years    Types: Cigarettes  . Smokeless tobacco: Never Used     Comment: Patient quit smoking in 1962  . Alcohol Use: No  . Drug Use: No  . Sexual Activity: Not on file   Other Topics Concern  . Not on file   Social History Narrative   Patient  Lives at home with his wife Danny Lucas)   Retired   Education 7 th grade   Right handed   Caffeine none     PHYSICAL EXAM   Filed Vitals:   09/04/13 1328  BP: 145/92  Pulse: 68  Height: 5' 9" (1.753 m)  Weight: 171 lb (77.565 kg)    Not recorded    Body mass index is 25.24 kg/(m^2).   Generalized: In no acute distress  Neck: Supple, no carotid bruits   Cardiac: Regular rate rhythm  Pulmonary: Clear to  auscultation bilaterally  Musculoskeletal: No deformity  Neurological examination  Mentation: Alert oriented to time, place, history taking, and causual conversation, Mini mental status examination is 25 out of 30, He is not oriented to month, could not spell worlds backwards  Cranial nerve II-XII: Pupils were equal round reactive to light. Extraocular movements were full.  Visual field were full on confrontational test. Bilateral fundi were sharp.  Facial sensation and strength were normal. Hearing was intact to finger rubbing bilaterally. Uvula tongue midline.  Head turning and shoulder shrug and were normal and symmetric.Tongue protrusion into cheek strength was normal.  Motor: Bilateral ankle dorsiflexion 4/4 plus ankle plantar flexion 4/4 plus, mild bilateral hip flexion weakness, 4/4, knee flexion 4/4 plus knee extension 5 5  Sensory: Length dependent decreased touch, pinprick to mid shin, vibratory sensation was present at bilateral knee level  Coordination: Normal finger to nose, heel-to-shin bilaterally there was no truncal ataxia  Gait: Need to push on chair  arm to get up from seated position, bilateral foot drop, right worse than left, very unsteady gait,  Romberg signs: positive  Deep tendon reflexes: Brachioradialis 2/2, biceps 2/2, triceps 2/2, patellar absent, Achilles absent, plantar responses were flexor bilaterally.   DIAGNOSTIC DATA (LABS, IMAGING, TESTING) - I reviewed patient records, labs, notes, testing and imaging myself where available.  Lab Results  Component Value Date   WBC 9.1 06/30/2009   HGB 11.1* 06/30/2009   HCT 32.0* 06/30/2009   MCV 90.2 06/30/2009   PLT 250 06/30/2009      Component Value Date/Time   NA 143 06/30/2009 0344   K 4.0 06/30/2009 0344   CL 107 06/30/2009 0344   CO2 30 06/30/2009 0344   GLUCOSE 119* 06/30/2009 0344   BUN 18 06/30/2009 0344   CREATININE 1.42 06/30/2009 0344   CALCIUM 8.2* 06/30/2009 0344   PROT 5.9* 06/23/2009 0400   ALBUMIN 2.8* 06/23/2009 0400   AST 33 06/23/2009 0400   ALT 46 06/23/2009 0400   ALKPHOS 55 06/23/2009 0400   BILITOT 1.0 06/23/2009 0400   GFRNONAA 48* 06/30/2009 0344   GFRAA  Value: 58        The eGFR has been calculated using the MDRD equation. This calculation has not been validated in all clinical situations. eGFR's persistently <60 mL/min signify possible Chronic Kidney Disease.* 06/30/2009 0344   Lab Results  Component Value Date   CHOL  Value: 130        ATP III CLASSIFICATION:  <200     mg/dL   Desirable  200-239  mg/dL   Borderline High  >=240    mg/dL   High        06/19/2009   HDL 35* 06/19/2009   LDLCALC  Value: 82        Total Cholesterol/HDL:CHD Risk Coronary Heart Disease Risk Table                     Men   Women  1/2 Average Risk   3.4   3.3  Average Risk       5.0   4.4  2 X Average Risk   9.6   7.1  3 X Average Risk  23.4   11.0        Use the calculated Patient Ratio above and the CHD Risk Table to determine the patient's CHD Risk.        ATP III CLASSIFICATION (LDL):  <100     mg/dL   Optimal  100-129  mg/dL   Near or Above                    Optimal  130-159  mg/dL    Borderline  160-189  mg/dL   High  >190     mg/dL   Very High 06/19/2009   TRIG 63 06/19/2009   CHOLHDL 3.7 06/19/2009   Lab Results  Component Value Date   HGBA1C  Value: 5.8 (NOTE) The ADA recommends the following therapeutic goal for glycemic control related to Hgb A1c measurement: Goal of therapy: <6.5 Hgb A1c  Reference: American Diabetes Association: Clinical Practice Recommendations 2010, Diabetes Care, 2010, 33: (Suppl  1). 06/19/2009   ASSESSMENT AND PLAN  Danny Lucas is a 80 y.o. male complains of worsening gait difficulty, most suggestive of lumbar sacral stenosis, MRI of lumbar spine from [facility] in March 30th  2015,there was  Multilevel degenerative disc disease, L3 and 4 left foraminal osteophyte, possible the local irradiation to her left L3 nerve roots, L4-5, broad based disc material with bilateral lateral recess foraminal narrowing, potentially affect L4 and V nerve roots, L5-S1, facet degeneration, without stenosis, or neural compression, no central canal stenosis.  Above MRI findings, and residual radiculopathy from previous decompression surgery, likely explaining his distal weakness, gait difficulty, continue moderate exercise,return to clinic in 6 months with Rhae Hammock, M.D. Ph.D.  [facility] [address], [place], [place] [address] [phone]

## 2013-09-12 ENCOUNTER — Ambulatory Visit (INDEPENDENT_AMBULATORY_CARE_PROVIDER_SITE_OTHER): Payer: Medicare FFS | Admitting: *Deleted

## 2013-09-12 DIAGNOSIS — Z5181 Encounter for therapeutic drug level monitoring: Secondary | ICD-10-CM

## 2013-09-12 DIAGNOSIS — I4891 Unspecified atrial fibrillation: Secondary | ICD-10-CM

## 2013-09-12 DIAGNOSIS — G459 Transient cerebral ischemic attack, unspecified: Secondary | ICD-10-CM

## 2013-09-12 LAB — POCT INR: INR: 1.9

## 2013-10-10 ENCOUNTER — Ambulatory Visit (INDEPENDENT_AMBULATORY_CARE_PROVIDER_SITE_OTHER): Payer: Medicare FFS | Admitting: *Deleted

## 2013-10-10 DIAGNOSIS — I4891 Unspecified atrial fibrillation: Secondary | ICD-10-CM

## 2013-10-10 DIAGNOSIS — G459 Transient cerebral ischemic attack, unspecified: Secondary | ICD-10-CM

## 2013-10-10 DIAGNOSIS — Z5181 Encounter for therapeutic drug level monitoring: Secondary | ICD-10-CM

## 2013-10-10 LAB — POCT INR: INR: 2.1

## 2013-11-07 ENCOUNTER — Ambulatory Visit (INDEPENDENT_AMBULATORY_CARE_PROVIDER_SITE_OTHER): Payer: Medicare FFS | Admitting: *Deleted

## 2013-11-07 DIAGNOSIS — Z5181 Encounter for therapeutic drug level monitoring: Secondary | ICD-10-CM

## 2013-11-07 DIAGNOSIS — G459 Transient cerebral ischemic attack, unspecified: Secondary | ICD-10-CM

## 2013-11-07 DIAGNOSIS — I4891 Unspecified atrial fibrillation: Secondary | ICD-10-CM

## 2013-11-07 LAB — POCT INR: INR: 1.5

## 2013-11-14 ENCOUNTER — Ambulatory Visit (INDEPENDENT_AMBULATORY_CARE_PROVIDER_SITE_OTHER): Payer: Medicare FFS | Admitting: *Deleted

## 2013-11-14 DIAGNOSIS — Z5181 Encounter for therapeutic drug level monitoring: Secondary | ICD-10-CM

## 2013-11-14 DIAGNOSIS — G459 Transient cerebral ischemic attack, unspecified: Secondary | ICD-10-CM

## 2013-11-14 DIAGNOSIS — I4891 Unspecified atrial fibrillation: Secondary | ICD-10-CM

## 2013-11-14 LAB — POCT INR: INR: 2.6

## 2013-12-05 ENCOUNTER — Ambulatory Visit (INDEPENDENT_AMBULATORY_CARE_PROVIDER_SITE_OTHER): Payer: Medicare FFS | Admitting: *Deleted

## 2013-12-05 DIAGNOSIS — I4891 Unspecified atrial fibrillation: Secondary | ICD-10-CM

## 2013-12-05 DIAGNOSIS — Z5181 Encounter for therapeutic drug level monitoring: Secondary | ICD-10-CM

## 2013-12-05 DIAGNOSIS — G459 Transient cerebral ischemic attack, unspecified: Secondary | ICD-10-CM

## 2013-12-05 LAB — POCT INR: INR: 2.7

## 2013-12-26 ENCOUNTER — Ambulatory Visit (INDEPENDENT_AMBULATORY_CARE_PROVIDER_SITE_OTHER): Payer: Medicare FFS | Admitting: *Deleted

## 2013-12-26 DIAGNOSIS — G459 Transient cerebral ischemic attack, unspecified: Secondary | ICD-10-CM

## 2013-12-26 DIAGNOSIS — Z5181 Encounter for therapeutic drug level monitoring: Secondary | ICD-10-CM

## 2013-12-26 DIAGNOSIS — I4891 Unspecified atrial fibrillation: Secondary | ICD-10-CM

## 2013-12-26 LAB — POCT INR: INR: 2.9

## 2014-01-23 ENCOUNTER — Ambulatory Visit (INDEPENDENT_AMBULATORY_CARE_PROVIDER_SITE_OTHER): Payer: Medicare FFS | Admitting: *Deleted

## 2014-01-23 DIAGNOSIS — I4891 Unspecified atrial fibrillation: Secondary | ICD-10-CM

## 2014-01-23 DIAGNOSIS — Z5181 Encounter for therapeutic drug level monitoring: Secondary | ICD-10-CM

## 2014-01-23 DIAGNOSIS — G459 Transient cerebral ischemic attack, unspecified: Secondary | ICD-10-CM

## 2014-01-23 LAB — POCT INR: INR: 2.4

## 2014-01-23 MED ORDER — WARFARIN SODIUM 5 MG PO TABS: ORAL_TABLET | ORAL | Status: DC

## 2014-02-27 ENCOUNTER — Ambulatory Visit (INDEPENDENT_AMBULATORY_CARE_PROVIDER_SITE_OTHER): Payer: Medicare FFS | Admitting: *Deleted

## 2014-02-27 DIAGNOSIS — I4891 Unspecified atrial fibrillation: Secondary | ICD-10-CM

## 2014-02-27 DIAGNOSIS — Z5181 Encounter for therapeutic drug level monitoring: Secondary | ICD-10-CM

## 2014-02-27 DIAGNOSIS — G459 Transient cerebral ischemic attack, unspecified: Secondary | ICD-10-CM

## 2014-02-27 LAB — POCT INR: INR: 2.3

## 2014-03-12 ENCOUNTER — Ambulatory Visit: Payer: Medicare PPO | Admitting: Nurse Practitioner

## 2014-03-27 ENCOUNTER — Ambulatory Visit (INDEPENDENT_AMBULATORY_CARE_PROVIDER_SITE_OTHER): Payer: Medicare FFS | Admitting: *Deleted

## 2014-03-27 DIAGNOSIS — I4891 Unspecified atrial fibrillation: Secondary | ICD-10-CM

## 2014-03-27 DIAGNOSIS — Z5181 Encounter for therapeutic drug level monitoring: Secondary | ICD-10-CM

## 2014-03-27 DIAGNOSIS — G459 Transient cerebral ischemic attack, unspecified: Secondary | ICD-10-CM

## 2014-03-27 LAB — POCT INR: INR: 2.2

## 2014-03-28 ENCOUNTER — Encounter: Payer: Self-pay | Admitting: Neurology

## 2014-04-03 ENCOUNTER — Encounter: Payer: Self-pay | Admitting: Neurology

## 2014-04-11 ENCOUNTER — Telehealth: Payer: Self-pay | Admitting: *Deleted

## 2014-04-11 NOTE — Telephone Encounter (Signed)
PT IS SCHEDULED FOR [facility] 04/18/14 AND THEY NEED INSTRUCTIONS ON LOVANOX BRIDGING FOR PATIENT FAXED TO THEM. DR Ruben Im [phone]

## 2014-04-13 ENCOUNTER — Telehealth: Payer: Self-pay | Admitting: Internal Medicine

## 2014-04-13 NOTE — Telephone Encounter (Signed)
New problem    FYI:Pt is in the hospital and pt had a super therapeutic INR per NP calling.

## 2014-04-16 NOTE — Telephone Encounter (Signed)
Pt in hospital in [place].  Will readdress issue after discharge if lithotripsy still needs to be done.

## 2014-06-05 ENCOUNTER — Ambulatory Visit (INDEPENDENT_AMBULATORY_CARE_PROVIDER_SITE_OTHER): Payer: Medicare FFS | Admitting: Internal Medicine

## 2014-06-05 ENCOUNTER — Encounter: Payer: Self-pay | Admitting: Internal Medicine

## 2014-06-05 VITALS — BP 112/70 | HR 97 | Ht 69.0 in | Wt 159.2 lb

## 2014-06-05 DIAGNOSIS — N200 Calculus of kidney: Secondary | ICD-10-CM

## 2014-06-05 DIAGNOSIS — I480 Paroxysmal atrial fibrillation: Secondary | ICD-10-CM

## 2014-06-05 DIAGNOSIS — I10 Essential (primary) hypertension: Secondary | ICD-10-CM

## 2014-06-05 DIAGNOSIS — I2510 Atherosclerotic heart disease of native coronary artery without angina pectoris: Secondary | ICD-10-CM

## 2014-06-05 DIAGNOSIS — Z01810 Encounter for preprocedural cardiovascular examination: Secondary | ICD-10-CM

## 2014-06-05 NOTE — Assessment & Plan Note (Signed)
He is currently maintaining NSR. He is not a candidate for anti-coagulation because of falls.

## 2014-06-05 NOTE — Progress Notes (Signed)
HPI [name] returns today for followup. He is a very pleasant 81 year old man with paroxysmal/persistent atrial fibrillation, coronary artery disease, history of TIAs, status post bypass surgery, with hypertension, and dyslipidemia.  He has previously not wanted to take systemic anticoagulation but reluctantly agreed to do so a year ago. Unfortunately he has had trouble with falls and had to come off of his systemic anti-coagulation. He main problem now has been kidney stones. He is not having pain but notes that he has had several large stones on the left which cannot be treated with lithotripsy. He was pending surgical removal at [facility]. He has made 3 trips there and still not had anything done. He is quite frustrated and unsatisfied with the treatment he has reveived and has asked me for a referral to one of our local urologists. He denies fever. He has had some gross hematuria.  Allergies  Allergen Reactions  . Amiodarone Hcl     Lost strength in legs.     Current Outpatient Prescriptions  Medication Sig Dispense Refill  . ACCU-CHEK SMARTVIEW test strip Use as directed    . doxazosin (CARDURA) 4 MG tablet Take 4 mg by mouth at bedtime.      . finasteride (PROPECIA) 1 MG tablet Take 1 mg by mouth daily.    . fluticasone (FLONASE) 50 MCG/ACT nasal spray Place 1 spray into the nose daily as needed for allergies.     Marland Kitchen levothyroxine (SYNTHROID, LEVOTHROID) 50 MCG tablet Take 50 mcg by mouth daily.    Marland Kitchen lisinopril (PRINIVIL,ZESTRIL) 5 MG tablet Take 5 mg by mouth daily.    . metoprolol (LOPRESSOR) 50 MG tablet Take 25 mg by mouth 2 (two) times daily.     . pantoprazole (PROTONIX) 40 MG tablet Take 40 mg by mouth daily as needed (heartburn or acid reflux).      No current facility-administered medications for this visit.     Past Medical History  Diagnosis Date  . Carotid artery occlusion     carotid artery stenosis  . Transient ischemic attack   . Hypertension   .  Myocardial infarction   . GERD (gastroesophageal reflux disease)   . Dysphagia   . BPH (benign prostatic hypertrophy)   . Hypothyroidism   . PONV (postoperative nausea and vomiting)   . History of kidney stones   . Atrial fibrillation     ROS:   All systems reviewed and negative except as noted in the HPI.   Past Surgical History  Procedure Laterality Date  . Coronary artery bypass graft      coronary artery bypass grafting time 6 with left internal mammery to ythe left anterior descending coronary artery ,reverse saphenous vein graft to the intermediate coronary artery sequential reverse saphenous vein graft to the first obtuse marginal and distal circumflex,sequential reverse saphenous vein graft to the acute  marginal and posterior descending coronary artery branch of the right corh  . Cholecystectomy    . Colonoscopy    . Upper gastrointestinal endoscopy    . Hernia repair Right 2006    [facility]  . Back surgery  2005    [name] in [place], [place]  . Esophagogastroduodenoscopy (egd) with esophageal dilation N/A 08/29/2012    Procedure: ESOPHAGOGASTRODUODENOSCOPY (EGD) WITH ESOPHAGEAL DILATION;  Surgeon: Malissa Hippo, MD;  [facility];  Service: Endoscopy;  Laterality: N/A;  1200     Family History  Problem Relation Age of Onset  . Heart Problems Mother  History   Social History  . Marital Status: Married    Spouse Name: Danny Lucas    Number of Children: 2  . Years of Education: 7 th   Occupational History  .      retired   Social History Main Topics  . Smoking status: Former Smoker -- 3.00 packs/day for 30 years    Types: Cigarettes  . Smokeless tobacco: Never Used     Comment: Patient quit smoking in 1962  . Alcohol Use: No  . Drug Use: No  . Sexual Activity: Not on file   Other Topics Concern  . Not on file   Social History Narrative   Patient  Lives at home with his wife Danny Lucas)   Retired   Education 7 th grade   Right handed    Caffeine none     BP 112/70 mmHg  Pulse 97  Ht  (1.753 m)  Wt 159 lb 3.2 oz (72.213 kg)  BMI 23.50 kg/m2  Physical Exam:  stable appearing 81 year old man,NAD HEENT: Unremarkable Neck:  7 cm JVD, no thyromegally Lungs:  Clear with no wheezes, rales, or rhonchi. HEART:  Regular rate rhythm, no murmurs, no rubs, no clicks Abd:  soft, positive bowel sounds, no organomegally, no rebound, no guarding Ext:  2 plus pulses, no edema, no cyanosis, no clubbing Skin:  No rashes no nodules Neuro:  CN II through XII intact, motor grossly intact  ECG - nsr  Assess/Plan:

## 2014-06-05 NOTE — Assessment & Plan Note (Signed)
While he does not have pain or fever, he does have hematuria. He has requested a change in care and states he does not want to go back to [facility] for his care. I have referred him to Dr. Isabel Caprice and associates.

## 2014-06-05 NOTE — Patient Instructions (Signed)
Your physician wants you to follow-up in: 12 months with Dr. Taylor You will receive a reminder letter in the mail two months in advance. If you don't receive a letter, please call our office to schedule the follow-up appointment.  Your physician recommends that you continue on your current medications as directed. Please refer to the Current Medication list given to you today.     

## 2014-06-05 NOTE — Assessment & Plan Note (Signed)
If the patient is indeed in need of urological surgery, he would be an acceptable risk to undergo general anesthesia.

## 2014-06-05 NOTE — Assessment & Plan Note (Signed)
He denies anginal symptoms. He will continue his current meds.  

## 2014-06-05 NOTE — Assessment & Plan Note (Signed)
His blood pressure is well controlled. He will remain on his beta blocker and ACE inhibitor for now.

## 2014-07-02 ENCOUNTER — Ambulatory Visit (INDEPENDENT_AMBULATORY_CARE_PROVIDER_SITE_OTHER): Payer: Medicare FFS | Admitting: *Deleted

## 2014-07-02 ENCOUNTER — Telehealth: Payer: Self-pay | Admitting: *Deleted

## 2014-07-02 DIAGNOSIS — Z5181 Encounter for therapeutic drug level monitoring: Secondary | ICD-10-CM

## 2014-07-02 DIAGNOSIS — G459 Transient cerebral ischemic attack, unspecified: Secondary | ICD-10-CM

## 2014-07-02 DIAGNOSIS — I4891 Unspecified atrial fibrillation: Secondary | ICD-10-CM

## 2014-07-02 LAB — POCT INR: INR: 1.7

## 2014-07-02 NOTE — Telephone Encounter (Signed)
Appt made for pt to come in for INR check today.  See coumadin note.

## 2014-07-02 NOTE — Telephone Encounter (Signed)
Pt was discharged from nursing home over weekend with INR of 4.2 they were told to follow up today/tmj

## 2014-07-10 ENCOUNTER — Ambulatory Visit (INDEPENDENT_AMBULATORY_CARE_PROVIDER_SITE_OTHER): Payer: Medicare PPO | Admitting: *Deleted

## 2014-07-10 DIAGNOSIS — Z5181 Encounter for therapeutic drug level monitoring: Secondary | ICD-10-CM

## 2014-07-10 DIAGNOSIS — G459 Transient cerebral ischemic attack, unspecified: Secondary | ICD-10-CM

## 2014-07-10 DIAGNOSIS — I4891 Unspecified atrial fibrillation: Secondary | ICD-10-CM

## 2014-07-10 LAB — POCT INR: INR: 2.6

## 2014-07-24 ENCOUNTER — Ambulatory Visit (INDEPENDENT_AMBULATORY_CARE_PROVIDER_SITE_OTHER): Payer: Medicare PPO | Admitting: *Deleted

## 2014-07-24 DIAGNOSIS — G459 Transient cerebral ischemic attack, unspecified: Secondary | ICD-10-CM

## 2014-07-24 DIAGNOSIS — Z5181 Encounter for therapeutic drug level monitoring: Secondary | ICD-10-CM

## 2014-07-24 DIAGNOSIS — I4891 Unspecified atrial fibrillation: Secondary | ICD-10-CM

## 2014-07-24 LAB — POCT INR: INR: 3.1

## 2014-08-14 ENCOUNTER — Ambulatory Visit (INDEPENDENT_AMBULATORY_CARE_PROVIDER_SITE_OTHER): Payer: Medicare PPO | Admitting: *Deleted

## 2014-08-14 DIAGNOSIS — I4891 Unspecified atrial fibrillation: Secondary | ICD-10-CM

## 2014-08-14 DIAGNOSIS — Z5181 Encounter for therapeutic drug level monitoring: Secondary | ICD-10-CM

## 2014-08-14 DIAGNOSIS — G459 Transient cerebral ischemic attack, unspecified: Secondary | ICD-10-CM

## 2014-08-14 LAB — POCT INR: INR: 2.6

## 2014-08-23 ENCOUNTER — Ambulatory Visit (INDEPENDENT_AMBULATORY_CARE_PROVIDER_SITE_OTHER): Payer: Medicare PPO | Admitting: *Deleted

## 2014-08-23 DIAGNOSIS — I4891 Unspecified atrial fibrillation: Secondary | ICD-10-CM

## 2014-08-23 DIAGNOSIS — G459 Transient cerebral ischemic attack, unspecified: Secondary | ICD-10-CM

## 2014-08-23 DIAGNOSIS — Z5181 Encounter for therapeutic drug level monitoring: Secondary | ICD-10-CM | POA: Diagnosis not present

## 2014-08-23 LAB — POCT INR: INR: 2.7

## 2014-08-23 MED ORDER — ENOXAPARIN SODIUM 100 MG/ML ~~LOC~~ SOLN: 100.0000 mg | SUBCUTANEOUS | Status: DC

## 2014-08-23 NOTE — Patient Instructions (Addendum)
Wt. 70kg on 08/22/14 SrCr  1.23   From 06/08/14 CrCl  46.34  4/15  Last dose of coumadin 4/16  No lovenox or coumadin 4/17  Lovenox 100mg  SQ @ 7am 4/18   Lovenox 100mg  SQ @ 7am 4/19   Lovenox 100mg  SQ @ 7am 4/20   Lovenox 100mg  SQ @ 6am 4/21  No lovenox-----procedure--- coumadin 7.5mg  pm 4/22  Lovenox 100mg  am and coumadin 7.5mg  pm 4/23  Lovenox 100mg  am and coumadin 7.5mg  pm 4/24  Lovenox 100mg  am and coumadin 7.5mg  pm 4/25  Lovenox 100mg  am and coumadin 7.5mg  pm 4/26  Lovenox 100mg  am and  INR appt @ 3:50pm  Lovenox 100mg  syringes #10 x 1 refill sent to [facility]

## 2014-08-30 DIAGNOSIS — Z86711 Personal history of pulmonary embolism: Secondary | ICD-10-CM | POA: Diagnosis not present

## 2014-08-30 DIAGNOSIS — N132 Hydronephrosis with renal and ureteral calculous obstruction: Secondary | ICD-10-CM | POA: Diagnosis not present

## 2014-08-30 DIAGNOSIS — E039 Hypothyroidism, unspecified: Secondary | ICD-10-CM | POA: Diagnosis not present

## 2014-08-30 DIAGNOSIS — I4891 Unspecified atrial fibrillation: Secondary | ICD-10-CM | POA: Diagnosis not present

## 2014-08-30 DIAGNOSIS — E119 Type 2 diabetes mellitus without complications: Secondary | ICD-10-CM | POA: Diagnosis not present

## 2014-08-30 DIAGNOSIS — Z8673 Personal history of transient ischemic attack (TIA), and cerebral infarction without residual deficits: Secondary | ICD-10-CM | POA: Diagnosis not present

## 2014-08-30 DIAGNOSIS — I10 Essential (primary) hypertension: Secondary | ICD-10-CM | POA: Diagnosis not present

## 2014-08-31 DIAGNOSIS — E119 Type 2 diabetes mellitus without complications: Secondary | ICD-10-CM | POA: Diagnosis not present

## 2014-08-31 DIAGNOSIS — E039 Hypothyroidism, unspecified: Secondary | ICD-10-CM | POA: Diagnosis not present

## 2014-08-31 DIAGNOSIS — Z86711 Personal history of pulmonary embolism: Secondary | ICD-10-CM | POA: Diagnosis not present

## 2014-08-31 DIAGNOSIS — I4891 Unspecified atrial fibrillation: Secondary | ICD-10-CM | POA: Diagnosis not present

## 2014-08-31 DIAGNOSIS — Z8673 Personal history of transient ischemic attack (TIA), and cerebral infarction without residual deficits: Secondary | ICD-10-CM | POA: Diagnosis not present

## 2014-08-31 DIAGNOSIS — N132 Hydronephrosis with renal and ureteral calculous obstruction: Secondary | ICD-10-CM | POA: Diagnosis not present

## 2014-08-31 DIAGNOSIS — I10 Essential (primary) hypertension: Secondary | ICD-10-CM | POA: Diagnosis not present

## 2014-09-01 DIAGNOSIS — N132 Hydronephrosis with renal and ureteral calculous obstruction: Secondary | ICD-10-CM | POA: Diagnosis not present

## 2014-09-01 DIAGNOSIS — E039 Hypothyroidism, unspecified: Secondary | ICD-10-CM | POA: Diagnosis not present

## 2014-09-01 DIAGNOSIS — I10 Essential (primary) hypertension: Secondary | ICD-10-CM | POA: Diagnosis not present

## 2014-09-01 DIAGNOSIS — I4891 Unspecified atrial fibrillation: Secondary | ICD-10-CM | POA: Diagnosis not present

## 2014-09-01 DIAGNOSIS — Z8673 Personal history of transient ischemic attack (TIA), and cerebral infarction without residual deficits: Secondary | ICD-10-CM | POA: Diagnosis not present

## 2014-09-01 DIAGNOSIS — E119 Type 2 diabetes mellitus without complications: Secondary | ICD-10-CM | POA: Diagnosis not present

## 2014-09-01 DIAGNOSIS — Z86711 Personal history of pulmonary embolism: Secondary | ICD-10-CM | POA: Diagnosis not present

## 2014-09-04 ENCOUNTER — Ambulatory Visit (INDEPENDENT_AMBULATORY_CARE_PROVIDER_SITE_OTHER): Payer: Medicare PPO | Admitting: *Deleted

## 2014-09-04 DIAGNOSIS — I4891 Unspecified atrial fibrillation: Secondary | ICD-10-CM

## 2014-09-04 DIAGNOSIS — Z5181 Encounter for therapeutic drug level monitoring: Secondary | ICD-10-CM

## 2014-09-04 DIAGNOSIS — G459 Transient cerebral ischemic attack, unspecified: Secondary | ICD-10-CM | POA: Diagnosis not present

## 2014-09-04 LAB — POCT INR: INR: 1.2

## 2014-09-06 ENCOUNTER — Ambulatory Visit (INDEPENDENT_AMBULATORY_CARE_PROVIDER_SITE_OTHER): Payer: Medicare PPO | Admitting: *Deleted

## 2014-09-06 DIAGNOSIS — I4891 Unspecified atrial fibrillation: Secondary | ICD-10-CM

## 2014-09-06 DIAGNOSIS — Z5181 Encounter for therapeutic drug level monitoring: Secondary | ICD-10-CM | POA: Diagnosis not present

## 2014-09-06 DIAGNOSIS — G459 Transient cerebral ischemic attack, unspecified: Secondary | ICD-10-CM

## 2014-09-06 LAB — POCT INR: INR: 1.8

## 2014-09-07 DIAGNOSIS — I69959 Hemiplegia and hemiparesis following unspecified cerebrovascular disease affecting unspecified side: Secondary | ICD-10-CM | POA: Diagnosis not present

## 2014-09-07 DIAGNOSIS — R269 Unspecified abnormalities of gait and mobility: Secondary | ICD-10-CM | POA: Diagnosis not present

## 2014-09-11 ENCOUNTER — Ambulatory Visit (INDEPENDENT_AMBULATORY_CARE_PROVIDER_SITE_OTHER): Payer: Medicare PPO | Admitting: *Deleted

## 2014-09-11 DIAGNOSIS — G459 Transient cerebral ischemic attack, unspecified: Secondary | ICD-10-CM | POA: Diagnosis not present

## 2014-09-11 DIAGNOSIS — Z5181 Encounter for therapeutic drug level monitoring: Secondary | ICD-10-CM

## 2014-09-11 DIAGNOSIS — I4891 Unspecified atrial fibrillation: Secondary | ICD-10-CM

## 2014-09-11 LAB — POCT INR: INR: 2.4

## 2014-10-02 ENCOUNTER — Ambulatory Visit (INDEPENDENT_AMBULATORY_CARE_PROVIDER_SITE_OTHER): Payer: Medicare PPO | Admitting: *Deleted

## 2014-10-02 DIAGNOSIS — I4891 Unspecified atrial fibrillation: Secondary | ICD-10-CM | POA: Diagnosis not present

## 2014-10-02 DIAGNOSIS — G459 Transient cerebral ischemic attack, unspecified: Secondary | ICD-10-CM

## 2014-10-02 DIAGNOSIS — Z5181 Encounter for therapeutic drug level monitoring: Secondary | ICD-10-CM | POA: Diagnosis not present

## 2014-10-02 LAB — POCT INR: INR: 2.1

## 2014-10-07 DIAGNOSIS — I69959 Hemiplegia and hemiparesis following unspecified cerebrovascular disease affecting unspecified side: Secondary | ICD-10-CM | POA: Diagnosis not present

## 2014-10-07 DIAGNOSIS — R269 Unspecified abnormalities of gait and mobility: Secondary | ICD-10-CM | POA: Diagnosis not present

## 2014-10-23 ENCOUNTER — Ambulatory Visit (INDEPENDENT_AMBULATORY_CARE_PROVIDER_SITE_OTHER): Payer: Medicare PPO | Admitting: *Deleted

## 2014-10-23 DIAGNOSIS — I4891 Unspecified atrial fibrillation: Secondary | ICD-10-CM | POA: Diagnosis not present

## 2014-10-23 DIAGNOSIS — Z5181 Encounter for therapeutic drug level monitoring: Secondary | ICD-10-CM | POA: Diagnosis not present

## 2014-10-23 DIAGNOSIS — G459 Transient cerebral ischemic attack, unspecified: Secondary | ICD-10-CM | POA: Diagnosis not present

## 2014-10-23 LAB — POCT INR: INR: 1.8

## 2014-11-07 DIAGNOSIS — R269 Unspecified abnormalities of gait and mobility: Secondary | ICD-10-CM | POA: Diagnosis not present

## 2014-11-07 DIAGNOSIS — I69959 Hemiplegia and hemiparesis following unspecified cerebrovascular disease affecting unspecified side: Secondary | ICD-10-CM | POA: Diagnosis not present

## 2014-11-08 ENCOUNTER — Ambulatory Visit (INDEPENDENT_AMBULATORY_CARE_PROVIDER_SITE_OTHER): Payer: Medicare PPO | Admitting: *Deleted

## 2014-11-08 DIAGNOSIS — Z5181 Encounter for therapeutic drug level monitoring: Secondary | ICD-10-CM

## 2014-11-08 DIAGNOSIS — G459 Transient cerebral ischemic attack, unspecified: Secondary | ICD-10-CM

## 2014-11-08 DIAGNOSIS — I4891 Unspecified atrial fibrillation: Secondary | ICD-10-CM

## 2014-11-08 LAB — POCT INR: INR: 1.8

## 2014-11-08 MED ORDER — METOPROLOL TARTRATE 25 MG PO TABS: 25.0000 mg | ORAL_TABLET | Freq: Two times a day (BID) | ORAL | Status: DC

## 2014-11-08 MED ORDER — WARFARIN SODIUM 5 MG PO TABS: ORAL_TABLET | ORAL | Status: DC

## 2014-11-14 DIAGNOSIS — E039 Hypothyroidism, unspecified: Secondary | ICD-10-CM | POA: Diagnosis not present

## 2014-11-14 DIAGNOSIS — E1122 Type 2 diabetes mellitus with diabetic chronic kidney disease: Secondary | ICD-10-CM | POA: Diagnosis not present

## 2014-11-14 DIAGNOSIS — I482 Chronic atrial fibrillation: Secondary | ICD-10-CM | POA: Diagnosis not present

## 2014-11-14 DIAGNOSIS — E782 Mixed hyperlipidemia: Secondary | ICD-10-CM | POA: Diagnosis not present

## 2014-11-14 DIAGNOSIS — N182 Chronic kidney disease, stage 2 (mild): Secondary | ICD-10-CM | POA: Diagnosis not present

## 2014-11-14 DIAGNOSIS — I2699 Other pulmonary embolism without acute cor pulmonale: Secondary | ICD-10-CM | POA: Diagnosis not present

## 2014-11-19 DIAGNOSIS — E119 Type 2 diabetes mellitus without complications: Secondary | ICD-10-CM | POA: Diagnosis not present

## 2014-11-19 DIAGNOSIS — R2681 Unsteadiness on feet: Secondary | ICD-10-CM | POA: Diagnosis not present

## 2014-11-19 DIAGNOSIS — E1122 Type 2 diabetes mellitus with diabetic chronic kidney disease: Secondary | ICD-10-CM | POA: Diagnosis not present

## 2014-11-19 DIAGNOSIS — I82509 Chronic embolism and thrombosis of unspecified deep veins of unspecified lower extremity: Secondary | ICD-10-CM | POA: Diagnosis not present

## 2014-11-19 DIAGNOSIS — E782 Mixed hyperlipidemia: Secondary | ICD-10-CM | POA: Diagnosis not present

## 2014-11-19 DIAGNOSIS — N182 Chronic kidney disease, stage 2 (mild): Secondary | ICD-10-CM | POA: Diagnosis not present

## 2014-11-19 DIAGNOSIS — E039 Hypothyroidism, unspecified: Secondary | ICD-10-CM | POA: Diagnosis not present

## 2014-11-19 DIAGNOSIS — I482 Chronic atrial fibrillation: Secondary | ICD-10-CM | POA: Diagnosis not present

## 2014-11-19 DIAGNOSIS — N200 Calculus of kidney: Secondary | ICD-10-CM | POA: Diagnosis not present

## 2014-11-27 ENCOUNTER — Ambulatory Visit (INDEPENDENT_AMBULATORY_CARE_PROVIDER_SITE_OTHER): Payer: Medicare PPO | Admitting: *Deleted

## 2014-11-27 DIAGNOSIS — G459 Transient cerebral ischemic attack, unspecified: Secondary | ICD-10-CM | POA: Diagnosis not present

## 2014-11-27 DIAGNOSIS — Z5181 Encounter for therapeutic drug level monitoring: Secondary | ICD-10-CM

## 2014-11-27 DIAGNOSIS — I4891 Unspecified atrial fibrillation: Secondary | ICD-10-CM | POA: Diagnosis not present

## 2014-11-27 LAB — POCT INR: INR: 2.9

## 2014-12-10 DIAGNOSIS — N200 Calculus of kidney: Secondary | ICD-10-CM | POA: Diagnosis not present

## 2014-12-10 DIAGNOSIS — Z87891 Personal history of nicotine dependence: Secondary | ICD-10-CM | POA: Diagnosis not present

## 2014-12-10 DIAGNOSIS — Z881 Allergy status to other antibiotic agents status: Secondary | ICD-10-CM | POA: Diagnosis not present

## 2014-12-10 DIAGNOSIS — N210 Calculus in bladder: Secondary | ICD-10-CM | POA: Diagnosis not present

## 2014-12-10 DIAGNOSIS — Z888 Allergy status to other drugs, medicaments and biological substances status: Secondary | ICD-10-CM | POA: Diagnosis not present

## 2014-12-10 DIAGNOSIS — N281 Cyst of kidney, acquired: Secondary | ICD-10-CM | POA: Diagnosis not present

## 2014-12-18 ENCOUNTER — Ambulatory Visit (INDEPENDENT_AMBULATORY_CARE_PROVIDER_SITE_OTHER): Payer: Medicare PPO | Admitting: *Deleted

## 2014-12-18 DIAGNOSIS — G459 Transient cerebral ischemic attack, unspecified: Secondary | ICD-10-CM | POA: Diagnosis not present

## 2014-12-18 DIAGNOSIS — I4891 Unspecified atrial fibrillation: Secondary | ICD-10-CM

## 2014-12-18 DIAGNOSIS — Z5181 Encounter for therapeutic drug level monitoring: Secondary | ICD-10-CM

## 2014-12-18 LAB — POCT INR: INR: 2.1

## 2015-01-15 ENCOUNTER — Ambulatory Visit (INDEPENDENT_AMBULATORY_CARE_PROVIDER_SITE_OTHER): Payer: Medicare PPO | Admitting: *Deleted

## 2015-01-15 DIAGNOSIS — Z5181 Encounter for therapeutic drug level monitoring: Secondary | ICD-10-CM

## 2015-01-15 DIAGNOSIS — G459 Transient cerebral ischemic attack, unspecified: Secondary | ICD-10-CM

## 2015-01-15 DIAGNOSIS — I4891 Unspecified atrial fibrillation: Secondary | ICD-10-CM

## 2015-01-15 LAB — POCT INR: INR: 2.6

## 2015-02-18 DIAGNOSIS — I639 Cerebral infarction, unspecified: Secondary | ICD-10-CM | POA: Diagnosis not present

## 2015-02-18 DIAGNOSIS — E1122 Type 2 diabetes mellitus with diabetic chronic kidney disease: Secondary | ICD-10-CM | POA: Diagnosis not present

## 2015-02-18 DIAGNOSIS — G619 Inflammatory polyneuropathy, unspecified: Secondary | ICD-10-CM | POA: Diagnosis not present

## 2015-02-18 DIAGNOSIS — Z23 Encounter for immunization: Secondary | ICD-10-CM | POA: Diagnosis not present

## 2015-02-18 DIAGNOSIS — E119 Type 2 diabetes mellitus without complications: Secondary | ICD-10-CM | POA: Diagnosis not present

## 2015-02-18 DIAGNOSIS — I82509 Chronic embolism and thrombosis of unspecified deep veins of unspecified lower extremity: Secondary | ICD-10-CM | POA: Diagnosis not present

## 2015-02-18 DIAGNOSIS — E039 Hypothyroidism, unspecified: Secondary | ICD-10-CM | POA: Diagnosis not present

## 2015-02-18 DIAGNOSIS — E782 Mixed hyperlipidemia: Secondary | ICD-10-CM | POA: Diagnosis not present

## 2015-02-18 DIAGNOSIS — I482 Chronic atrial fibrillation: Secondary | ICD-10-CM | POA: Diagnosis not present

## 2015-02-26 ENCOUNTER — Ambulatory Visit (INDEPENDENT_AMBULATORY_CARE_PROVIDER_SITE_OTHER): Payer: Medicare PPO | Admitting: *Deleted

## 2015-02-26 DIAGNOSIS — I4891 Unspecified atrial fibrillation: Secondary | ICD-10-CM

## 2015-02-26 DIAGNOSIS — Z5181 Encounter for therapeutic drug level monitoring: Secondary | ICD-10-CM

## 2015-02-26 DIAGNOSIS — G459 Transient cerebral ischemic attack, unspecified: Secondary | ICD-10-CM | POA: Diagnosis not present

## 2015-02-26 LAB — POCT INR: INR: 3.5

## 2015-03-21 ENCOUNTER — Ambulatory Visit (INDEPENDENT_AMBULATORY_CARE_PROVIDER_SITE_OTHER): Payer: Medicare PPO | Admitting: *Deleted

## 2015-03-21 DIAGNOSIS — I4891 Unspecified atrial fibrillation: Secondary | ICD-10-CM

## 2015-03-21 DIAGNOSIS — Z5181 Encounter for therapeutic drug level monitoring: Secondary | ICD-10-CM | POA: Diagnosis not present

## 2015-03-21 DIAGNOSIS — G459 Transient cerebral ischemic attack, unspecified: Secondary | ICD-10-CM | POA: Diagnosis not present

## 2015-03-21 LAB — POCT INR: INR: 2.8

## 2015-04-16 ENCOUNTER — Ambulatory Visit (INDEPENDENT_AMBULATORY_CARE_PROVIDER_SITE_OTHER): Payer: Medicare PPO | Admitting: *Deleted

## 2015-04-16 DIAGNOSIS — Z5181 Encounter for therapeutic drug level monitoring: Secondary | ICD-10-CM | POA: Diagnosis not present

## 2015-04-16 DIAGNOSIS — I4891 Unspecified atrial fibrillation: Secondary | ICD-10-CM | POA: Diagnosis not present

## 2015-04-16 DIAGNOSIS — G459 Transient cerebral ischemic attack, unspecified: Secondary | ICD-10-CM

## 2015-04-16 LAB — POCT INR: INR: 2.8

## 2015-05-21 ENCOUNTER — Ambulatory Visit (INDEPENDENT_AMBULATORY_CARE_PROVIDER_SITE_OTHER): Payer: Medicare PPO | Admitting: *Deleted

## 2015-05-21 DIAGNOSIS — I4891 Unspecified atrial fibrillation: Secondary | ICD-10-CM

## 2015-05-21 DIAGNOSIS — G459 Transient cerebral ischemic attack, unspecified: Secondary | ICD-10-CM

## 2015-05-21 DIAGNOSIS — Z5181 Encounter for therapeutic drug level monitoring: Secondary | ICD-10-CM

## 2015-05-21 LAB — POCT INR: INR: 2.6

## 2015-05-22 DIAGNOSIS — E039 Hypothyroidism, unspecified: Secondary | ICD-10-CM | POA: Diagnosis not present

## 2015-05-22 DIAGNOSIS — E782 Mixed hyperlipidemia: Secondary | ICD-10-CM | POA: Diagnosis not present

## 2015-05-22 DIAGNOSIS — I2699 Other pulmonary embolism without acute cor pulmonale: Secondary | ICD-10-CM | POA: Diagnosis not present

## 2015-05-22 DIAGNOSIS — N182 Chronic kidney disease, stage 2 (mild): Secondary | ICD-10-CM | POA: Diagnosis not present

## 2015-05-22 DIAGNOSIS — E1122 Type 2 diabetes mellitus with diabetic chronic kidney disease: Secondary | ICD-10-CM | POA: Diagnosis not present

## 2015-05-22 DIAGNOSIS — I482 Chronic atrial fibrillation: Secondary | ICD-10-CM | POA: Diagnosis not present

## 2015-05-27 DIAGNOSIS — E039 Hypothyroidism, unspecified: Secondary | ICD-10-CM | POA: Diagnosis not present

## 2015-05-27 DIAGNOSIS — E119 Type 2 diabetes mellitus without complications: Secondary | ICD-10-CM | POA: Diagnosis not present

## 2015-05-27 DIAGNOSIS — E1122 Type 2 diabetes mellitus with diabetic chronic kidney disease: Secondary | ICD-10-CM | POA: Diagnosis not present

## 2015-05-27 DIAGNOSIS — G619 Inflammatory polyneuropathy, unspecified: Secondary | ICD-10-CM | POA: Diagnosis not present

## 2015-05-27 DIAGNOSIS — E782 Mixed hyperlipidemia: Secondary | ICD-10-CM | POA: Diagnosis not present

## 2015-05-27 DIAGNOSIS — I639 Cerebral infarction, unspecified: Secondary | ICD-10-CM | POA: Diagnosis not present

## 2015-05-27 DIAGNOSIS — N182 Chronic kidney disease, stage 2 (mild): Secondary | ICD-10-CM | POA: Diagnosis not present

## 2015-05-27 DIAGNOSIS — I482 Chronic atrial fibrillation: Secondary | ICD-10-CM | POA: Diagnosis not present

## 2015-05-27 DIAGNOSIS — I82509 Chronic embolism and thrombosis of unspecified deep veins of unspecified lower extremity: Secondary | ICD-10-CM | POA: Diagnosis not present

## 2015-06-12 DIAGNOSIS — G609 Hereditary and idiopathic neuropathy, unspecified: Secondary | ICD-10-CM | POA: Diagnosis not present

## 2015-06-12 DIAGNOSIS — B351 Tinea unguium: Secondary | ICD-10-CM | POA: Diagnosis not present

## 2015-07-02 ENCOUNTER — Ambulatory Visit (INDEPENDENT_AMBULATORY_CARE_PROVIDER_SITE_OTHER): Payer: Medicare PPO | Admitting: *Deleted

## 2015-07-02 DIAGNOSIS — I4891 Unspecified atrial fibrillation: Secondary | ICD-10-CM

## 2015-07-02 DIAGNOSIS — Z5181 Encounter for therapeutic drug level monitoring: Secondary | ICD-10-CM

## 2015-07-02 DIAGNOSIS — G459 Transient cerebral ischemic attack, unspecified: Secondary | ICD-10-CM | POA: Diagnosis not present

## 2015-07-02 LAB — POCT INR: INR: 2.6

## 2015-07-19 ENCOUNTER — Encounter: Payer: Self-pay | Admitting: Internal Medicine

## 2015-07-19 ENCOUNTER — Ambulatory Visit (INDEPENDENT_AMBULATORY_CARE_PROVIDER_SITE_OTHER): Payer: Medicare PPO | Admitting: Internal Medicine

## 2015-07-19 VITALS — BP 120/72 | HR 68 | Ht 69.0 in | Wt 156.0 lb

## 2015-07-19 DIAGNOSIS — I480 Paroxysmal atrial fibrillation: Secondary | ICD-10-CM | POA: Diagnosis not present

## 2015-07-19 MED ORDER — WARFARIN SODIUM 5 MG PO TABS: ORAL_TABLET | ORAL | Status: DC

## 2015-07-19 MED ORDER — METOPROLOL TARTRATE 25 MG PO TABS: 25.0000 mg | ORAL_TABLET | Freq: Two times a day (BID) | ORAL | Status: DC

## 2015-07-19 NOTE — Patient Instructions (Signed)
Your physician wants you to follow-up in: 1 year Dr Taylor You will receive a reminder letter in the mail two months in advance. If you don't receive a letter, please call our office to schedule the follow-up appointment.     Your physician recommends that you continue on your current medications as directed. Please refer to the Current Medication list given to you today.    If you need a refill on your cardiac medications before your next appointment, please call your pharmacy.     Thank you for choosing [facility] !        

## 2015-07-19 NOTE — Progress Notes (Signed)
HPI Mr. Brigante returns today for followup. He is a very pleasant 82 year old man with paroxysmal/persistent atrial fibrillation, coronary artery disease, history of TIAs, status post bypass surgery, with hypertension, and dyslipidemia.  He has previously not wanted to take systemic anticoagulation but reluctantly agreed to do so a year ago. He remains on warfarin and has been stable. His renal stones are under control although he apparently needs to drink more fluid. He does have some difficulty swallowing after his stroke. No recent falls. He denies fever. He has had some gross hematuria.  Allergies  Allergen Reactions  . Amiodarone Hcl     Lost strength in legs.     Current Outpatient Prescriptions  Medication Sig Dispense Refill  . ACCU-CHEK SMARTVIEW test strip Use as directed    . doxazosin (CARDURA) 4 MG tablet Take 4 mg by mouth at bedtime.      . fluticasone (FLONASE) 50 MCG/ACT nasal spray Place 1 spray into the nose daily as needed for allergies.     Marland Kitchen levothyroxine (SYNTHROID, LEVOTHROID) 75 MCG tablet Take 75 mcg by mouth daily before breakfast. TAKE 1/2 TABLET DAILY    . lisinopril (PRINIVIL,ZESTRIL) 5 MG tablet Take 5 mg by mouth daily.    . metoprolol tartrate (LOPRESSOR) 25 MG tablet Take 1 tablet (25 mg total) by mouth 2 (two) times daily. 180 tablet 3  . tamsulosin (FLOMAX) 0.4 MG CAPS capsule Take 0.4 mg by mouth daily.    Marland Kitchen warfarin (COUMADIN) 5 MG tablet Take 1 1/2 tablets daily or as directed 135 tablet 3   No current facility-administered medications for this visit.     Past Medical History  Diagnosis Date  . Carotid artery occlusion     carotid artery stenosis  . Transient ischemic attack   . Hypertension   . Myocardial infarction (HCC)   . GERD (gastroesophageal reflux disease)   . Dysphagia   . BPH (benign prostatic hypertrophy)   . Hypothyroidism   . PONV (postoperative nausea and vomiting)   . History of kidney stones   . Atrial fibrillation (HCC)      ROS:   All systems reviewed and negative except as noted in the HPI.   Past Surgical History  Procedure Laterality Date  . Coronary artery bypass graft      coronary artery bypass grafting time 6 with left internal mammery to ythe left anterior descending coronary artery ,reverse saphenous vein graft to the intermediate coronary artery sequential reverse saphenous vein graft to the first obtuse marginal and distal circumflex,sequential reverse saphenous vein graft to the acute  marginal and posterior descending coronary artery branch of the right corh  . Cholecystectomy    . Colonoscopy    . Upper gastrointestinal endoscopy    . Hernia repair Right 2006    [facility]  . Back surgery  2005    [facility] in [place], [place]  . Esophagogastroduodenoscopy (egd) with esophageal dilation N/A 08/29/2012    Procedure: ESOPHAGOGASTRODUODENOSCOPY (EGD) WITH ESOPHAGEAL DILATION;  Surgeon: Malissa Hippo, MD;  [facility];  Service: Endoscopy;  Laterality: N/A;  1200     Family History  Problem Relation Age of Onset  . Heart Problems Mother      Social History   Social History  . Marital Status: Married    Spouse Name: [name]  . Number of Children: 2  . Years of Education: 7 th   Occupational History  .      retired   Chief Executive Officer  History Main Topics  . Smoking status: Former Smoker -- 3.00 packs/day for 30 years    Types: Cigarettes    Quit date: 05/20/1960  . Smokeless tobacco: Never Used     Comment: Patient quit smoking in 1962  . Alcohol Use: No  . Drug Use: No  . Sexual Activity: Not on file   Other Topics Concern  . Not on file   Social History Narrative   Patient  Lives at home with his wife [name])   Retired   Education 7 th grade   Right handed   Caffeine none     BP 120/72 mmHg  Pulse 68  Ht 5\' 9"  (1.753 m)  Wt 156 lb (70.761 kg)  BMI 23.03 kg/m2  SpO2 91%  Physical Exam:  stable appearing 82 year old man,NAD HEENT:  Unremarkable Neck:  7 cm JVD, no thyromegally Lungs:  Clear with no wheezes, rales, or rhonchi. HEART:  Regular rate rhythm, no murmurs, no rubs, no clicks Abd:  soft, positive bowel sounds, no organomegally, no rebound, no guarding Ext:  2 plus pulses, no edema, no cyanosis, no clubbing Skin:  No rashes no nodules Neuro:  CN II through XII intact, motor grossly intact  Assess/Plan: 1. PAF - his symptoms are controlled. He will continue his current meds including warfarin. 2. HTN - his blood pressure is controlled. No change in meds. 3. Coags - he has not had any bleeding or falling episodes.   Leonia Reeves.D.

## 2015-07-23 ENCOUNTER — Telehealth: Payer: Self-pay | Admitting: Internal Medicine

## 2015-07-23 NOTE — Telephone Encounter (Signed)
Betty at Dr. Tinnie Gens Johnson's office in [place] called and lvm stating that the pt is needing to have a tooth extracted. Pt  is on blood thinners, pls call Kathie Rhodes @ [phone] and let her know when the pt can go off and how long he needs to be off of them before he has his tooth extracted

## 2015-07-24 NOTE — Telephone Encounter (Signed)
Spoke with Kathie Rhodes with Dr. Henriette Combs office.  She confirmed pt was only having 1 tooth removed and had asked to be referred to an oral surgeon.  Pt has a history of TIAs (CHADS score of 4).  He is at very high risk to stop anticoagulation.  Our recommendation would be to continue Coumadin for the extraction.  Will fax this information to Dr. Henriette Combs office. Fax number:  [phone]

## 2015-08-13 ENCOUNTER — Ambulatory Visit (INDEPENDENT_AMBULATORY_CARE_PROVIDER_SITE_OTHER): Payer: Medicare PPO | Admitting: *Deleted

## 2015-08-13 DIAGNOSIS — Z5181 Encounter for therapeutic drug level monitoring: Secondary | ICD-10-CM | POA: Diagnosis not present

## 2015-08-13 DIAGNOSIS — G459 Transient cerebral ischemic attack, unspecified: Secondary | ICD-10-CM | POA: Diagnosis not present

## 2015-08-13 DIAGNOSIS — I4891 Unspecified atrial fibrillation: Secondary | ICD-10-CM

## 2015-08-13 LAB — POCT INR: INR: 3.1

## 2015-09-10 ENCOUNTER — Ambulatory Visit (INDEPENDENT_AMBULATORY_CARE_PROVIDER_SITE_OTHER): Payer: Medicare PPO | Admitting: *Deleted

## 2015-09-10 DIAGNOSIS — Z5181 Encounter for therapeutic drug level monitoring: Secondary | ICD-10-CM

## 2015-09-10 DIAGNOSIS — G459 Transient cerebral ischemic attack, unspecified: Secondary | ICD-10-CM | POA: Diagnosis not present

## 2015-09-10 DIAGNOSIS — I4891 Unspecified atrial fibrillation: Secondary | ICD-10-CM

## 2015-09-10 LAB — POCT INR: INR: 2.9

## 2015-10-08 ENCOUNTER — Ambulatory Visit (INDEPENDENT_AMBULATORY_CARE_PROVIDER_SITE_OTHER): Payer: Medicare PPO | Admitting: *Deleted

## 2015-10-08 DIAGNOSIS — G459 Transient cerebral ischemic attack, unspecified: Secondary | ICD-10-CM

## 2015-10-08 DIAGNOSIS — I4891 Unspecified atrial fibrillation: Secondary | ICD-10-CM | POA: Diagnosis not present

## 2015-10-08 DIAGNOSIS — Z5181 Encounter for therapeutic drug level monitoring: Secondary | ICD-10-CM

## 2015-10-08 LAB — POCT INR: INR: 3.2

## 2015-10-31 ENCOUNTER — Ambulatory Visit (INDEPENDENT_AMBULATORY_CARE_PROVIDER_SITE_OTHER): Payer: Medicare PPO | Admitting: *Deleted

## 2015-10-31 DIAGNOSIS — G459 Transient cerebral ischemic attack, unspecified: Secondary | ICD-10-CM

## 2015-10-31 DIAGNOSIS — I4891 Unspecified atrial fibrillation: Secondary | ICD-10-CM

## 2015-10-31 DIAGNOSIS — Z5181 Encounter for therapeutic drug level monitoring: Secondary | ICD-10-CM | POA: Diagnosis not present

## 2015-10-31 LAB — POCT INR: INR: 2.5

## 2015-11-20 DIAGNOSIS — N182 Chronic kidney disease, stage 2 (mild): Secondary | ICD-10-CM | POA: Diagnosis not present

## 2015-11-20 DIAGNOSIS — E1122 Type 2 diabetes mellitus with diabetic chronic kidney disease: Secondary | ICD-10-CM | POA: Diagnosis not present

## 2015-11-20 DIAGNOSIS — E039 Hypothyroidism, unspecified: Secondary | ICD-10-CM | POA: Diagnosis not present

## 2015-11-20 DIAGNOSIS — E782 Mixed hyperlipidemia: Secondary | ICD-10-CM | POA: Diagnosis not present

## 2015-11-25 DIAGNOSIS — I639 Cerebral infarction, unspecified: Secondary | ICD-10-CM | POA: Diagnosis not present

## 2015-11-25 DIAGNOSIS — E119 Type 2 diabetes mellitus without complications: Secondary | ICD-10-CM | POA: Diagnosis not present

## 2015-11-25 DIAGNOSIS — E782 Mixed hyperlipidemia: Secondary | ICD-10-CM | POA: Diagnosis not present

## 2015-11-25 DIAGNOSIS — E039 Hypothyroidism, unspecified: Secondary | ICD-10-CM | POA: Diagnosis not present

## 2015-11-25 DIAGNOSIS — E1122 Type 2 diabetes mellitus with diabetic chronic kidney disease: Secondary | ICD-10-CM | POA: Diagnosis not present

## 2015-11-25 DIAGNOSIS — Z6822 Body mass index (BMI) 22.0-22.9, adult: Secondary | ICD-10-CM | POA: Diagnosis not present

## 2015-11-25 DIAGNOSIS — G619 Inflammatory polyneuropathy, unspecified: Secondary | ICD-10-CM | POA: Diagnosis not present

## 2015-11-25 DIAGNOSIS — I482 Chronic atrial fibrillation: Secondary | ICD-10-CM | POA: Diagnosis not present

## 2015-11-28 ENCOUNTER — Ambulatory Visit (INDEPENDENT_AMBULATORY_CARE_PROVIDER_SITE_OTHER): Payer: Medicare PPO | Admitting: *Deleted

## 2015-11-28 DIAGNOSIS — Z5181 Encounter for therapeutic drug level monitoring: Secondary | ICD-10-CM | POA: Diagnosis not present

## 2015-11-28 DIAGNOSIS — G459 Transient cerebral ischemic attack, unspecified: Secondary | ICD-10-CM

## 2015-11-28 DIAGNOSIS — I4891 Unspecified atrial fibrillation: Secondary | ICD-10-CM | POA: Diagnosis not present

## 2015-11-28 LAB — POCT INR: INR: 2.7

## 2015-12-09 DIAGNOSIS — N200 Calculus of kidney: Secondary | ICD-10-CM | POA: Diagnosis not present

## 2015-12-09 DIAGNOSIS — Z87442 Personal history of urinary calculi: Secondary | ICD-10-CM | POA: Diagnosis not present

## 2015-12-09 DIAGNOSIS — N281 Cyst of kidney, acquired: Secondary | ICD-10-CM | POA: Diagnosis not present

## 2015-12-09 DIAGNOSIS — N401 Enlarged prostate with lower urinary tract symptoms: Secondary | ICD-10-CM | POA: Diagnosis not present

## 2015-12-09 DIAGNOSIS — R3915 Urgency of urination: Secondary | ICD-10-CM | POA: Diagnosis not present

## 2015-12-09 DIAGNOSIS — R350 Frequency of micturition: Secondary | ICD-10-CM | POA: Diagnosis not present

## 2015-12-12 ENCOUNTER — Telehealth: Payer: Self-pay | Admitting: *Deleted

## 2015-12-12 NOTE — Telephone Encounter (Signed)
please call pt, he wamts Dr Ladona Ridgel to do a report on his health so he can take it to [facility] and get his DL back. thanks

## 2015-12-12 NOTE — Telephone Encounter (Signed)
Dr Ladona Ridgel is out until 12/23/15 and will be back in the [place] office that day.  Will forward to them and Dr Ladona Ridgel to review then

## 2015-12-19 ENCOUNTER — Telehealth: Payer: Self-pay | Admitting: Internal Medicine

## 2015-12-19 NOTE — Telephone Encounter (Signed)
New message       Pt is trying to get a report sent to get his driving license back in order to support himself. His bills are substantially behind and he needs a job but in order to do so he needs to be able to drive again. Please call.

## 2015-12-19 NOTE — Telephone Encounter (Signed)
Pt calling to request if Dr Ladona Ridgel would write him a clearance letter from a cardiac standpoint to go to the [facility] and retest to get his driver's license back.  Pt states that he needs his driver's license to go back to working in [employer].  Pt states that the [employer] he works for, requires him to commute to [place] [employer] [place].  Pt states that his bills are getting very far behind, and he fears he will lose all he has.  Pt states that he was advised that he would have to contact one of his Medical Providers to write a report stating its safe for him to drive a regular vehicle.  Informed the pt that Dr Ladona Ridgel and Tresa Endo RN are both out of the office this week, but I will route this message to them both for further review, recommendation, and follow-up with the pt thereafter.  Pt states if Dr Ladona Ridgel would write this, this would change "his circumstances." Pt request when the report is ready, he would like this mailed to his current address.  Pt verbalized understanding and agrees with this plan.

## 2015-12-25 ENCOUNTER — Encounter: Payer: Self-pay | Admitting: *Deleted

## 2015-12-25 NOTE — Telephone Encounter (Signed)
Discussed with Dr Ladona Ridgel and he says Danny Lucas has no cardiac condition that would preclude him from driving  Letter mailed to patient.

## 2015-12-31 ENCOUNTER — Ambulatory Visit (INDEPENDENT_AMBULATORY_CARE_PROVIDER_SITE_OTHER): Payer: Medicare PPO | Admitting: *Deleted

## 2015-12-31 DIAGNOSIS — I4891 Unspecified atrial fibrillation: Secondary | ICD-10-CM

## 2015-12-31 DIAGNOSIS — Z5181 Encounter for therapeutic drug level monitoring: Secondary | ICD-10-CM | POA: Diagnosis not present

## 2015-12-31 DIAGNOSIS — G459 Transient cerebral ischemic attack, unspecified: Secondary | ICD-10-CM

## 2015-12-31 LAB — POCT INR: INR: 3.3

## 2016-01-01 ENCOUNTER — Ambulatory Visit (INDEPENDENT_AMBULATORY_CARE_PROVIDER_SITE_OTHER): Payer: Medicare PPO | Admitting: *Deleted

## 2016-01-01 DIAGNOSIS — G459 Transient cerebral ischemic attack, unspecified: Secondary | ICD-10-CM

## 2016-01-01 DIAGNOSIS — I4891 Unspecified atrial fibrillation: Secondary | ICD-10-CM | POA: Diagnosis not present

## 2016-01-01 DIAGNOSIS — Z5181 Encounter for therapeutic drug level monitoring: Secondary | ICD-10-CM | POA: Diagnosis not present

## 2016-01-01 LAB — POCT INR: INR: 2.5

## 2016-01-06 ENCOUNTER — Telehealth: Payer: Self-pay | Admitting: Internal Medicine

## 2016-01-06 NOTE — Telephone Encounter (Signed)
Spoke with patient and let him know I mailed the letter on 12/25/15.

## 2016-01-06 NOTE — Telephone Encounter (Signed)
New message  Pt call concerning a report to be mailed to him for his drivers license. Please call back to discuss

## 2016-01-28 ENCOUNTER — Ambulatory Visit (INDEPENDENT_AMBULATORY_CARE_PROVIDER_SITE_OTHER): Payer: Medicare PPO | Admitting: *Deleted

## 2016-01-28 DIAGNOSIS — G459 Transient cerebral ischemic attack, unspecified: Secondary | ICD-10-CM

## 2016-01-28 DIAGNOSIS — I4891 Unspecified atrial fibrillation: Secondary | ICD-10-CM

## 2016-01-28 DIAGNOSIS — Z5181 Encounter for therapeutic drug level monitoring: Secondary | ICD-10-CM | POA: Diagnosis not present

## 2016-01-28 LAB — POCT INR: INR: 3.3

## 2016-02-18 ENCOUNTER — Ambulatory Visit (INDEPENDENT_AMBULATORY_CARE_PROVIDER_SITE_OTHER): Payer: Medicare PPO | Admitting: *Deleted

## 2016-02-18 DIAGNOSIS — I4891 Unspecified atrial fibrillation: Secondary | ICD-10-CM

## 2016-02-18 DIAGNOSIS — Z5181 Encounter for therapeutic drug level monitoring: Secondary | ICD-10-CM | POA: Diagnosis not present

## 2016-02-18 DIAGNOSIS — G459 Transient cerebral ischemic attack, unspecified: Secondary | ICD-10-CM | POA: Diagnosis not present

## 2016-02-18 LAB — POCT INR: INR: 3.1

## 2016-03-10 DIAGNOSIS — J209 Acute bronchitis, unspecified: Secondary | ICD-10-CM | POA: Diagnosis not present

## 2016-03-10 DIAGNOSIS — J019 Acute sinusitis, unspecified: Secondary | ICD-10-CM | POA: Diagnosis not present

## 2016-03-10 DIAGNOSIS — J029 Acute pharyngitis, unspecified: Secondary | ICD-10-CM | POA: Diagnosis not present

## 2016-03-13 DIAGNOSIS — G619 Inflammatory polyneuropathy, unspecified: Secondary | ICD-10-CM | POA: Diagnosis not present

## 2016-03-13 DIAGNOSIS — E1122 Type 2 diabetes mellitus with diabetic chronic kidney disease: Secondary | ICD-10-CM | POA: Diagnosis not present

## 2016-03-13 DIAGNOSIS — Z6823 Body mass index (BMI) 23.0-23.9, adult: Secondary | ICD-10-CM | POA: Diagnosis not present

## 2016-03-13 DIAGNOSIS — J209 Acute bronchitis, unspecified: Secondary | ICD-10-CM | POA: Diagnosis not present

## 2016-03-13 DIAGNOSIS — I482 Chronic atrial fibrillation: Secondary | ICD-10-CM | POA: Diagnosis not present

## 2016-03-18 ENCOUNTER — Telehealth: Payer: Self-pay | Admitting: *Deleted

## 2016-03-18 NOTE — Telephone Encounter (Signed)
Spoke with wife.  Appt made for 11/30 @ 3pm.  Wife in agreement.

## 2016-03-18 NOTE — Telephone Encounter (Signed)
Mrs. Jirak called stating that Mr. Crafts had his coumdin checked on 03-13-16 by PCP. Dr. Leandrew Koyanagi.  Reading was 3.1  Patient needs to know when he is suppose to come back for another check.  Will forward to [employer].Marland Kitchen RN

## 2016-04-09 ENCOUNTER — Ambulatory Visit (INDEPENDENT_AMBULATORY_CARE_PROVIDER_SITE_OTHER): Payer: Medicare PPO | Admitting: *Deleted

## 2016-04-09 DIAGNOSIS — Z5181 Encounter for therapeutic drug level monitoring: Secondary | ICD-10-CM

## 2016-04-09 DIAGNOSIS — I4891 Unspecified atrial fibrillation: Secondary | ICD-10-CM

## 2016-04-09 DIAGNOSIS — G459 Transient cerebral ischemic attack, unspecified: Secondary | ICD-10-CM

## 2016-04-09 LAB — POCT INR: INR: 2.3

## 2016-05-07 ENCOUNTER — Ambulatory Visit (INDEPENDENT_AMBULATORY_CARE_PROVIDER_SITE_OTHER): Payer: Medicare PPO | Admitting: *Deleted

## 2016-05-07 DIAGNOSIS — I4891 Unspecified atrial fibrillation: Secondary | ICD-10-CM

## 2016-05-07 DIAGNOSIS — Z5181 Encounter for therapeutic drug level monitoring: Secondary | ICD-10-CM | POA: Diagnosis not present

## 2016-05-07 DIAGNOSIS — G459 Transient cerebral ischemic attack, unspecified: Secondary | ICD-10-CM | POA: Diagnosis not present

## 2016-05-07 LAB — POCT INR: INR: 2.7

## 2016-05-29 DIAGNOSIS — E039 Hypothyroidism, unspecified: Secondary | ICD-10-CM | POA: Diagnosis not present

## 2016-05-29 DIAGNOSIS — Z86718 Personal history of other venous thrombosis and embolism: Secondary | ICD-10-CM | POA: Diagnosis not present

## 2016-05-29 DIAGNOSIS — E782 Mixed hyperlipidemia: Secondary | ICD-10-CM | POA: Diagnosis not present

## 2016-05-29 DIAGNOSIS — E1122 Type 2 diabetes mellitus with diabetic chronic kidney disease: Secondary | ICD-10-CM | POA: Diagnosis not present

## 2016-06-03 DIAGNOSIS — G619 Inflammatory polyneuropathy, unspecified: Secondary | ICD-10-CM | POA: Diagnosis not present

## 2016-06-03 DIAGNOSIS — I639 Cerebral infarction, unspecified: Secondary | ICD-10-CM | POA: Diagnosis not present

## 2016-06-03 DIAGNOSIS — Z23 Encounter for immunization: Secondary | ICD-10-CM | POA: Diagnosis not present

## 2016-06-03 DIAGNOSIS — Z6823 Body mass index (BMI) 23.0-23.9, adult: Secondary | ICD-10-CM | POA: Diagnosis not present

## 2016-06-03 DIAGNOSIS — Z0001 Encounter for general adult medical examination with abnormal findings: Secondary | ICD-10-CM | POA: Diagnosis not present

## 2016-06-03 DIAGNOSIS — E1143 Type 2 diabetes mellitus with diabetic autonomic (poly)neuropathy: Secondary | ICD-10-CM | POA: Diagnosis not present

## 2016-06-03 DIAGNOSIS — I482 Chronic atrial fibrillation: Secondary | ICD-10-CM | POA: Diagnosis not present

## 2016-06-03 DIAGNOSIS — E1122 Type 2 diabetes mellitus with diabetic chronic kidney disease: Secondary | ICD-10-CM | POA: Diagnosis not present

## 2016-06-08 DIAGNOSIS — I82509 Chronic embolism and thrombosis of unspecified deep veins of unspecified lower extremity: Secondary | ICD-10-CM | POA: Diagnosis not present

## 2016-06-08 DIAGNOSIS — M6281 Muscle weakness (generalized): Secondary | ICD-10-CM | POA: Diagnosis not present

## 2016-06-08 DIAGNOSIS — N182 Chronic kidney disease, stage 2 (mild): Secondary | ICD-10-CM | POA: Diagnosis not present

## 2016-06-08 DIAGNOSIS — E1122 Type 2 diabetes mellitus with diabetic chronic kidney disease: Secondary | ICD-10-CM | POA: Diagnosis not present

## 2016-06-08 DIAGNOSIS — E1142 Type 2 diabetes mellitus with diabetic polyneuropathy: Secondary | ICD-10-CM | POA: Diagnosis not present

## 2016-06-08 DIAGNOSIS — I482 Chronic atrial fibrillation: Secondary | ICD-10-CM | POA: Diagnosis not present

## 2016-06-18 ENCOUNTER — Ambulatory Visit (INDEPENDENT_AMBULATORY_CARE_PROVIDER_SITE_OTHER): Payer: Medicare PPO | Admitting: *Deleted

## 2016-06-18 DIAGNOSIS — Z5181 Encounter for therapeutic drug level monitoring: Secondary | ICD-10-CM | POA: Diagnosis not present

## 2016-06-18 DIAGNOSIS — I4891 Unspecified atrial fibrillation: Secondary | ICD-10-CM | POA: Diagnosis not present

## 2016-06-18 DIAGNOSIS — G459 Transient cerebral ischemic attack, unspecified: Secondary | ICD-10-CM

## 2016-06-18 LAB — POCT INR: INR: 2.4

## 2016-07-23 ENCOUNTER — Ambulatory Visit (INDEPENDENT_AMBULATORY_CARE_PROVIDER_SITE_OTHER): Payer: Medicare PPO | Admitting: Internal Medicine

## 2016-07-23 ENCOUNTER — Encounter: Payer: Self-pay | Admitting: Internal Medicine

## 2016-07-23 VITALS — BP 110/68 | HR 58 | Ht 69.0 in | Wt 158.0 lb

## 2016-07-23 DIAGNOSIS — I4891 Unspecified atrial fibrillation: Secondary | ICD-10-CM | POA: Diagnosis not present

## 2016-07-23 NOTE — Patient Instructions (Signed)
Your physician wants you to follow-up in: 1 Year Dr. Ladona Ridgel. You will receive a reminder letter in the mail two months in advance. If you don't receive a letter, please call our office to schedule the follow-up appointment.  Your physician recommends that you continue on your current medications as directed. Please refer to the Current Medication list given to you today.  If you need a refill on your cardiac medications before your next appointment, please call your pharmacy.  Thank you for choosing [facility]!

## 2016-07-23 NOTE — Progress Notes (Signed)
HPI Mr. Debo returns today for followup. He is a very pleasant 83 year old man with paroxysmal/persistent atrial fibrillation, coronary artery disease, history of TIAs, status post bypass surgery, with hypertension, and dyslipidemia.  He has previously not wanted to take systemic anticoagulation but reluctantly agreed to do so 2 years ago. He remains on warfarin and has been stable. His renal stones are under control although he apparently needs to drink more fluid. He does have some difficulty swallowing after his stroke. No recent falls. He denies fever. He remains frustrated by his family not allowing him to do the things he wants to do like drive a car. Allergies  Allergen Reactions  . Amiodarone Hcl     Lost strength in legs.     Current Outpatient Prescriptions  Medication Sig Dispense Refill  . ACCU-CHEK SMARTVIEW test strip Use as directed    . doxazosin (CARDURA) 4 MG tablet Take 4 mg by mouth at bedtime.      . fluticasone (FLONASE) 50 MCG/ACT nasal spray Place 1 spray into the nose daily as needed for allergies.     Marland Kitchen levothyroxine (SYNTHROID, LEVOTHROID) 75 MCG tablet Take 75 mcg by mouth daily before breakfast. TAKE 1/2 TABLET DAILY    . lisinopril (PRINIVIL,ZESTRIL) 5 MG tablet Take 5 mg by mouth daily.    . metoprolol tartrate (LOPRESSOR) 25 MG tablet Take 1 tablet (25 mg total) by mouth 2 (two) times daily. 180 tablet 3  . tamsulosin (FLOMAX) 0.4 MG CAPS capsule Take 0.4 mg by mouth daily.    Marland Kitchen warfarin (COUMADIN) 5 MG tablet Take 1 1/2 tablets daily or as directed 135 tablet 3   No current facility-administered medications for this visit.      Past Medical History:  Diagnosis Date  . Atrial fibrillation (HCC)   . BPH (benign prostatic hypertrophy)   . Carotid artery occlusion    carotid artery stenosis  . Dysphagia   . GERD (gastroesophageal reflux disease)   . History of kidney stones   . Hypertension   . Hypothyroidism   . Myocardial infarction   . PONV  (postoperative nausea and vomiting)   . Transient ischemic attack     ROS:   All systems reviewed and negative except as noted in the HPI.   Past Surgical History:  Procedure Laterality Date  . BACK SURGERY  [address] in [place], [place]  . CHOLECYSTECTOMY    . COLONOSCOPY    . CORONARY ARTERY BYPASS GRAFT     coronary artery bypass grafting time 6 with left internal mammery to ythe left anterior descending coronary artery ,reverse saphenous vein graft to the intermediate coronary artery sequential reverse saphenous vein graft to the first obtuse marginal and distal circumflex,sequential reverse saphenous vein graft to the acute  marginal and posterior descending coronary artery branch of the right corh  . ESOPHAGOGASTRODUODENOSCOPY (EGD) WITH ESOPHAGEAL DILATION N/A 08/29/2012   Procedure: ESOPHAGOGASTRODUODENOSCOPY (EGD) WITH ESOPHAGEAL DILATION;  Surgeon: Malissa Hippo, MD;  [facility];  Service: Endoscopy;  Laterality: N/A;  1200  . HERNIA REPAIR Right 2006   [facility]  . UPPER GASTROINTESTINAL ENDOSCOPY       Family History  Problem Relation Age of Onset  . Heart Problems Mother      Social History   Social History  . Marital status: Married    Spouse name: [name]  . Number of children: 2  . Years of education: 7 th   Occupational History  .  Retired    retired   Social History Main Topics  . Smoking status: Former Smoker    Packs/day: 3.00    Years: 30.00    Types: Cigarettes    Quit date: 05/20/1960  . Smokeless tobacco: Never Used     Comment: Patient quit smoking in 1962  . Alcohol use No  . Drug use: No  . Sexual activity: Not on file   Other Topics Concern  . Not on file   Social History Narrative   Patient  Lives at home with his wife [name])   Retired   Education 7 th grade   Right handed   Caffeine none     BP 110/68   Pulse (!) 58   Ht 5\' 9"  (1.753 m)   Wt 158 lb (71.7 kg)   SpO2 97%   BMI 23.33 kg/m    Physical Exam:  stable appearing 83 year old man,NAD HEENT: Unremarkable Neck:  7 cm JVD, no thyromegally Lungs:  Clear with no wheezes, rales, or rhonchi. HEART:  Regular rate rhythm, no murmurs, no rubs, no clicks Abd:  soft, positive bowel sounds, no organomegally, no rebound, no guarding Ext:  2 plus pulses, no edema, no cyanosis, no clubbing Skin:  No rashes no nodules Neuro:  CN II through XII intact, motor grossly intact  Assess/Plan: 1. PAF - his symptoms are controlled. He will continue his current meds including warfarin. 2. HTN - his blood pressure is controlled. No change in meds. 3. Coags - he has not had any bleeding or falling episodes.   Leonia Reeves.D.

## 2016-07-30 ENCOUNTER — Ambulatory Visit (INDEPENDENT_AMBULATORY_CARE_PROVIDER_SITE_OTHER): Payer: Medicare PPO | Admitting: *Deleted

## 2016-07-30 DIAGNOSIS — Z5181 Encounter for therapeutic drug level monitoring: Secondary | ICD-10-CM

## 2016-07-30 DIAGNOSIS — I4891 Unspecified atrial fibrillation: Secondary | ICD-10-CM

## 2016-07-30 DIAGNOSIS — G459 Transient cerebral ischemic attack, unspecified: Secondary | ICD-10-CM

## 2016-07-30 LAB — POCT INR: INR: 2.5

## 2016-08-19 ENCOUNTER — Other Ambulatory Visit: Payer: Self-pay | Admitting: Internal Medicine

## 2016-08-27 DIAGNOSIS — R2681 Unsteadiness on feet: Secondary | ICD-10-CM | POA: Diagnosis not present

## 2016-08-27 DIAGNOSIS — I482 Chronic atrial fibrillation: Secondary | ICD-10-CM | POA: Diagnosis not present

## 2016-08-27 DIAGNOSIS — E1143 Type 2 diabetes mellitus with diabetic autonomic (poly)neuropathy: Secondary | ICD-10-CM | POA: Diagnosis not present

## 2016-08-27 DIAGNOSIS — G619 Inflammatory polyneuropathy, unspecified: Secondary | ICD-10-CM | POA: Diagnosis not present

## 2016-08-27 DIAGNOSIS — E1122 Type 2 diabetes mellitus with diabetic chronic kidney disease: Secondary | ICD-10-CM | POA: Diagnosis not present

## 2016-08-27 DIAGNOSIS — N182 Chronic kidney disease, stage 2 (mild): Secondary | ICD-10-CM | POA: Diagnosis not present

## 2016-08-27 DIAGNOSIS — E039 Hypothyroidism, unspecified: Secondary | ICD-10-CM | POA: Diagnosis not present

## 2016-08-27 DIAGNOSIS — I82509 Chronic embolism and thrombosis of unspecified deep veins of unspecified lower extremity: Secondary | ICD-10-CM | POA: Diagnosis not present

## 2016-08-31 DIAGNOSIS — B351 Tinea unguium: Secondary | ICD-10-CM | POA: Diagnosis not present

## 2016-08-31 DIAGNOSIS — M79676 Pain in unspecified toe(s): Secondary | ICD-10-CM | POA: Diagnosis not present

## 2016-09-10 ENCOUNTER — Ambulatory Visit (INDEPENDENT_AMBULATORY_CARE_PROVIDER_SITE_OTHER): Payer: Medicare PPO | Admitting: *Deleted

## 2016-09-10 DIAGNOSIS — I4891 Unspecified atrial fibrillation: Secondary | ICD-10-CM

## 2016-09-10 DIAGNOSIS — G459 Transient cerebral ischemic attack, unspecified: Secondary | ICD-10-CM

## 2016-09-10 DIAGNOSIS — Z5181 Encounter for therapeutic drug level monitoring: Secondary | ICD-10-CM | POA: Diagnosis not present

## 2016-09-10 LAB — POCT INR: INR: 2.2

## 2016-10-22 ENCOUNTER — Ambulatory Visit (INDEPENDENT_AMBULATORY_CARE_PROVIDER_SITE_OTHER): Payer: Medicare PPO | Admitting: *Deleted

## 2016-10-22 DIAGNOSIS — G459 Transient cerebral ischemic attack, unspecified: Secondary | ICD-10-CM

## 2016-10-22 DIAGNOSIS — Z5181 Encounter for therapeutic drug level monitoring: Secondary | ICD-10-CM

## 2016-10-22 DIAGNOSIS — I4891 Unspecified atrial fibrillation: Secondary | ICD-10-CM | POA: Diagnosis not present

## 2016-10-22 LAB — POCT INR: INR: 2.9

## 2016-11-04 DIAGNOSIS — R198 Other specified symptoms and signs involving the digestive system and abdomen: Secondary | ICD-10-CM | POA: Diagnosis not present

## 2016-11-04 DIAGNOSIS — Z87442 Personal history of urinary calculi: Secondary | ICD-10-CM | POA: Diagnosis not present

## 2016-11-04 DIAGNOSIS — N200 Calculus of kidney: Secondary | ICD-10-CM | POA: Diagnosis not present

## 2016-11-19 DIAGNOSIS — E1143 Type 2 diabetes mellitus with diabetic autonomic (poly)neuropathy: Secondary | ICD-10-CM | POA: Diagnosis not present

## 2016-11-19 DIAGNOSIS — E1122 Type 2 diabetes mellitus with diabetic chronic kidney disease: Secondary | ICD-10-CM | POA: Diagnosis not present

## 2016-11-19 DIAGNOSIS — E782 Mixed hyperlipidemia: Secondary | ICD-10-CM | POA: Diagnosis not present

## 2016-11-19 DIAGNOSIS — I482 Chronic atrial fibrillation: Secondary | ICD-10-CM | POA: Diagnosis not present

## 2016-11-23 DIAGNOSIS — E1122 Type 2 diabetes mellitus with diabetic chronic kidney disease: Secondary | ICD-10-CM | POA: Diagnosis not present

## 2016-11-23 DIAGNOSIS — E039 Hypothyroidism, unspecified: Secondary | ICD-10-CM | POA: Diagnosis not present

## 2016-11-23 DIAGNOSIS — N182 Chronic kidney disease, stage 2 (mild): Secondary | ICD-10-CM | POA: Diagnosis not present

## 2016-11-23 DIAGNOSIS — E870 Hyperosmolality and hypernatremia: Secondary | ICD-10-CM | POA: Diagnosis not present

## 2016-11-23 DIAGNOSIS — E1143 Type 2 diabetes mellitus with diabetic autonomic (poly)neuropathy: Secondary | ICD-10-CM | POA: Diagnosis not present

## 2016-11-23 DIAGNOSIS — E782 Mixed hyperlipidemia: Secondary | ICD-10-CM | POA: Diagnosis not present

## 2016-11-23 DIAGNOSIS — I82509 Chronic embolism and thrombosis of unspecified deep veins of unspecified lower extremity: Secondary | ICD-10-CM | POA: Diagnosis not present

## 2016-11-23 DIAGNOSIS — I482 Chronic atrial fibrillation: Secondary | ICD-10-CM | POA: Diagnosis not present

## 2016-12-08 ENCOUNTER — Ambulatory Visit (INDEPENDENT_AMBULATORY_CARE_PROVIDER_SITE_OTHER): Payer: Medicare PPO | Admitting: *Deleted

## 2016-12-08 DIAGNOSIS — G459 Transient cerebral ischemic attack, unspecified: Secondary | ICD-10-CM

## 2016-12-08 DIAGNOSIS — I4891 Unspecified atrial fibrillation: Secondary | ICD-10-CM | POA: Diagnosis not present

## 2016-12-08 DIAGNOSIS — Z5181 Encounter for therapeutic drug level monitoring: Secondary | ICD-10-CM | POA: Diagnosis not present

## 2016-12-08 LAB — POCT INR: INR: 2.6

## 2017-01-19 ENCOUNTER — Ambulatory Visit (INDEPENDENT_AMBULATORY_CARE_PROVIDER_SITE_OTHER): Payer: Medicare PPO | Admitting: *Deleted

## 2017-01-19 DIAGNOSIS — Z5181 Encounter for therapeutic drug level monitoring: Secondary | ICD-10-CM

## 2017-01-19 DIAGNOSIS — G459 Transient cerebral ischemic attack, unspecified: Secondary | ICD-10-CM

## 2017-01-19 DIAGNOSIS — I4891 Unspecified atrial fibrillation: Secondary | ICD-10-CM

## 2017-01-19 LAB — POCT INR: INR: 2.2

## 2017-02-22 DIAGNOSIS — E870 Hyperosmolality and hypernatremia: Secondary | ICD-10-CM | POA: Diagnosis not present

## 2017-02-22 DIAGNOSIS — E1122 Type 2 diabetes mellitus with diabetic chronic kidney disease: Secondary | ICD-10-CM | POA: Diagnosis not present

## 2017-02-26 DIAGNOSIS — E870 Hyperosmolality and hypernatremia: Secondary | ICD-10-CM | POA: Diagnosis not present

## 2017-02-26 DIAGNOSIS — E1143 Type 2 diabetes mellitus with diabetic autonomic (poly)neuropathy: Secondary | ICD-10-CM | POA: Diagnosis not present

## 2017-02-26 DIAGNOSIS — Z23 Encounter for immunization: Secondary | ICD-10-CM | POA: Diagnosis not present

## 2017-02-26 DIAGNOSIS — E1122 Type 2 diabetes mellitus with diabetic chronic kidney disease: Secondary | ICD-10-CM | POA: Diagnosis not present

## 2017-02-26 DIAGNOSIS — I482 Chronic atrial fibrillation: Secondary | ICD-10-CM | POA: Diagnosis not present

## 2017-02-26 DIAGNOSIS — G619 Inflammatory polyneuropathy, unspecified: Secondary | ICD-10-CM | POA: Diagnosis not present

## 2017-02-26 DIAGNOSIS — E782 Mixed hyperlipidemia: Secondary | ICD-10-CM | POA: Diagnosis not present

## 2017-02-26 DIAGNOSIS — E039 Hypothyroidism, unspecified: Secondary | ICD-10-CM | POA: Diagnosis not present

## 2017-02-26 DIAGNOSIS — Z6824 Body mass index (BMI) 24.0-24.9, adult: Secondary | ICD-10-CM | POA: Diagnosis not present

## 2017-03-02 ENCOUNTER — Ambulatory Visit (INDEPENDENT_AMBULATORY_CARE_PROVIDER_SITE_OTHER): Payer: Medicare PPO | Admitting: *Deleted

## 2017-03-02 DIAGNOSIS — G459 Transient cerebral ischemic attack, unspecified: Secondary | ICD-10-CM

## 2017-03-02 DIAGNOSIS — Z5181 Encounter for therapeutic drug level monitoring: Secondary | ICD-10-CM

## 2017-03-02 DIAGNOSIS — I4891 Unspecified atrial fibrillation: Secondary | ICD-10-CM

## 2017-03-02 LAB — POCT INR: INR: 3.2

## 2017-03-03 DIAGNOSIS — R2681 Unsteadiness on feet: Secondary | ICD-10-CM | POA: Diagnosis not present

## 2017-03-03 DIAGNOSIS — M6281 Muscle weakness (generalized): Secondary | ICD-10-CM | POA: Diagnosis not present

## 2017-03-05 DIAGNOSIS — R2681 Unsteadiness on feet: Secondary | ICD-10-CM | POA: Diagnosis not present

## 2017-03-05 DIAGNOSIS — M6281 Muscle weakness (generalized): Secondary | ICD-10-CM | POA: Diagnosis not present

## 2017-03-09 DIAGNOSIS — R2681 Unsteadiness on feet: Secondary | ICD-10-CM | POA: Diagnosis not present

## 2017-03-09 DIAGNOSIS — M6281 Muscle weakness (generalized): Secondary | ICD-10-CM | POA: Diagnosis not present

## 2017-03-11 DIAGNOSIS — R2681 Unsteadiness on feet: Secondary | ICD-10-CM | POA: Diagnosis not present

## 2017-03-11 DIAGNOSIS — M6281 Muscle weakness (generalized): Secondary | ICD-10-CM | POA: Diagnosis not present

## 2017-03-16 DIAGNOSIS — R2681 Unsteadiness on feet: Secondary | ICD-10-CM | POA: Diagnosis not present

## 2017-03-16 DIAGNOSIS — M6281 Muscle weakness (generalized): Secondary | ICD-10-CM | POA: Diagnosis not present

## 2017-03-18 DIAGNOSIS — M6281 Muscle weakness (generalized): Secondary | ICD-10-CM | POA: Diagnosis not present

## 2017-03-18 DIAGNOSIS — R2681 Unsteadiness on feet: Secondary | ICD-10-CM | POA: Diagnosis not present

## 2017-03-23 DIAGNOSIS — M6281 Muscle weakness (generalized): Secondary | ICD-10-CM | POA: Diagnosis not present

## 2017-03-23 DIAGNOSIS — R2681 Unsteadiness on feet: Secondary | ICD-10-CM | POA: Diagnosis not present

## 2017-03-30 ENCOUNTER — Ambulatory Visit (INDEPENDENT_AMBULATORY_CARE_PROVIDER_SITE_OTHER): Payer: Medicare PPO | Admitting: *Deleted

## 2017-03-30 DIAGNOSIS — Z5181 Encounter for therapeutic drug level monitoring: Secondary | ICD-10-CM | POA: Diagnosis not present

## 2017-03-30 DIAGNOSIS — G459 Transient cerebral ischemic attack, unspecified: Secondary | ICD-10-CM

## 2017-03-30 DIAGNOSIS — I4891 Unspecified atrial fibrillation: Secondary | ICD-10-CM

## 2017-03-30 DIAGNOSIS — R2681 Unsteadiness on feet: Secondary | ICD-10-CM | POA: Diagnosis not present

## 2017-03-30 DIAGNOSIS — M6281 Muscle weakness (generalized): Secondary | ICD-10-CM | POA: Diagnosis not present

## 2017-03-30 LAB — POCT INR: INR: 2.8

## 2017-04-07 DIAGNOSIS — R2681 Unsteadiness on feet: Secondary | ICD-10-CM | POA: Diagnosis not present

## 2017-04-07 DIAGNOSIS — M6281 Muscle weakness (generalized): Secondary | ICD-10-CM | POA: Diagnosis not present

## 2017-04-09 DIAGNOSIS — M6281 Muscle weakness (generalized): Secondary | ICD-10-CM | POA: Diagnosis not present

## 2017-04-09 DIAGNOSIS — R2681 Unsteadiness on feet: Secondary | ICD-10-CM | POA: Diagnosis not present

## 2017-04-13 DIAGNOSIS — R296 Repeated falls: Secondary | ICD-10-CM | POA: Diagnosis not present

## 2017-04-13 DIAGNOSIS — M6281 Muscle weakness (generalized): Secondary | ICD-10-CM | POA: Diagnosis not present

## 2017-04-13 DIAGNOSIS — R2681 Unsteadiness on feet: Secondary | ICD-10-CM | POA: Diagnosis not present

## 2017-04-15 DIAGNOSIS — M6281 Muscle weakness (generalized): Secondary | ICD-10-CM | POA: Diagnosis not present

## 2017-04-15 DIAGNOSIS — R2681 Unsteadiness on feet: Secondary | ICD-10-CM | POA: Diagnosis not present

## 2017-04-15 DIAGNOSIS — R296 Repeated falls: Secondary | ICD-10-CM | POA: Diagnosis not present

## 2017-04-27 ENCOUNTER — Ambulatory Visit (INDEPENDENT_AMBULATORY_CARE_PROVIDER_SITE_OTHER): Payer: Medicare PPO | Admitting: *Deleted

## 2017-04-27 DIAGNOSIS — M6281 Muscle weakness (generalized): Secondary | ICD-10-CM | POA: Diagnosis not present

## 2017-04-27 DIAGNOSIS — Z5181 Encounter for therapeutic drug level monitoring: Secondary | ICD-10-CM | POA: Diagnosis not present

## 2017-04-27 DIAGNOSIS — R296 Repeated falls: Secondary | ICD-10-CM | POA: Diagnosis not present

## 2017-04-27 DIAGNOSIS — G459 Transient cerebral ischemic attack, unspecified: Secondary | ICD-10-CM

## 2017-04-27 DIAGNOSIS — I4891 Unspecified atrial fibrillation: Secondary | ICD-10-CM

## 2017-04-27 DIAGNOSIS — R2681 Unsteadiness on feet: Secondary | ICD-10-CM | POA: Diagnosis not present

## 2017-04-27 LAB — POCT INR: INR: 2.9

## 2017-04-29 DIAGNOSIS — M6281 Muscle weakness (generalized): Secondary | ICD-10-CM | POA: Diagnosis not present

## 2017-04-29 DIAGNOSIS — R296 Repeated falls: Secondary | ICD-10-CM | POA: Diagnosis not present

## 2017-04-29 DIAGNOSIS — R2681 Unsteadiness on feet: Secondary | ICD-10-CM | POA: Diagnosis not present

## 2017-05-06 DIAGNOSIS — R2681 Unsteadiness on feet: Secondary | ICD-10-CM | POA: Diagnosis not present

## 2017-05-06 DIAGNOSIS — R296 Repeated falls: Secondary | ICD-10-CM | POA: Diagnosis not present

## 2017-05-06 DIAGNOSIS — M6281 Muscle weakness (generalized): Secondary | ICD-10-CM | POA: Diagnosis not present

## 2017-05-13 DIAGNOSIS — R2681 Unsteadiness on feet: Secondary | ICD-10-CM | POA: Diagnosis not present

## 2017-05-13 DIAGNOSIS — M6281 Muscle weakness (generalized): Secondary | ICD-10-CM | POA: Diagnosis not present

## 2017-05-13 DIAGNOSIS — R296 Repeated falls: Secondary | ICD-10-CM | POA: Diagnosis not present

## 2017-05-18 DIAGNOSIS — R2681 Unsteadiness on feet: Secondary | ICD-10-CM | POA: Diagnosis not present

## 2017-05-18 DIAGNOSIS — R296 Repeated falls: Secondary | ICD-10-CM | POA: Diagnosis not present

## 2017-05-18 DIAGNOSIS — M6281 Muscle weakness (generalized): Secondary | ICD-10-CM | POA: Diagnosis not present

## 2017-05-20 DIAGNOSIS — M6281 Muscle weakness (generalized): Secondary | ICD-10-CM | POA: Diagnosis not present

## 2017-05-20 DIAGNOSIS — R2681 Unsteadiness on feet: Secondary | ICD-10-CM | POA: Diagnosis not present

## 2017-05-20 DIAGNOSIS — R296 Repeated falls: Secondary | ICD-10-CM | POA: Diagnosis not present

## 2017-05-21 DIAGNOSIS — I82409 Acute embolism and thrombosis of unspecified deep veins of unspecified lower extremity: Secondary | ICD-10-CM | POA: Diagnosis not present

## 2017-05-21 DIAGNOSIS — N182 Chronic kidney disease, stage 2 (mild): Secondary | ICD-10-CM | POA: Diagnosis not present

## 2017-05-21 DIAGNOSIS — I482 Chronic atrial fibrillation: Secondary | ICD-10-CM | POA: Diagnosis not present

## 2017-05-21 DIAGNOSIS — E1143 Type 2 diabetes mellitus with diabetic autonomic (poly)neuropathy: Secondary | ICD-10-CM | POA: Diagnosis not present

## 2017-05-21 DIAGNOSIS — E870 Hyperosmolality and hypernatremia: Secondary | ICD-10-CM | POA: Diagnosis not present

## 2017-05-21 DIAGNOSIS — E782 Mixed hyperlipidemia: Secondary | ICD-10-CM | POA: Diagnosis not present

## 2017-05-21 DIAGNOSIS — I82509 Chronic embolism and thrombosis of unspecified deep veins of unspecified lower extremity: Secondary | ICD-10-CM | POA: Diagnosis not present

## 2017-05-21 DIAGNOSIS — E1122 Type 2 diabetes mellitus with diabetic chronic kidney disease: Secondary | ICD-10-CM | POA: Diagnosis not present

## 2017-05-25 DIAGNOSIS — G619 Inflammatory polyneuropathy, unspecified: Secondary | ICD-10-CM | POA: Diagnosis not present

## 2017-05-25 DIAGNOSIS — R296 Repeated falls: Secondary | ICD-10-CM | POA: Diagnosis not present

## 2017-05-25 DIAGNOSIS — M6281 Muscle weakness (generalized): Secondary | ICD-10-CM | POA: Diagnosis not present

## 2017-05-25 DIAGNOSIS — E039 Hypothyroidism, unspecified: Secondary | ICD-10-CM | POA: Diagnosis not present

## 2017-05-25 DIAGNOSIS — I639 Cerebral infarction, unspecified: Secondary | ICD-10-CM | POA: Diagnosis not present

## 2017-05-25 DIAGNOSIS — I482 Chronic atrial fibrillation: Secondary | ICD-10-CM | POA: Diagnosis not present

## 2017-05-25 DIAGNOSIS — E870 Hyperosmolality and hypernatremia: Secondary | ICD-10-CM | POA: Diagnosis not present

## 2017-05-25 DIAGNOSIS — E1143 Type 2 diabetes mellitus with diabetic autonomic (poly)neuropathy: Secondary | ICD-10-CM | POA: Diagnosis not present

## 2017-05-25 DIAGNOSIS — R2681 Unsteadiness on feet: Secondary | ICD-10-CM | POA: Diagnosis not present

## 2017-05-25 DIAGNOSIS — E782 Mixed hyperlipidemia: Secondary | ICD-10-CM | POA: Diagnosis not present

## 2017-06-01 ENCOUNTER — Ambulatory Visit (INDEPENDENT_AMBULATORY_CARE_PROVIDER_SITE_OTHER): Payer: Medicare PPO | Admitting: *Deleted

## 2017-06-01 DIAGNOSIS — Z7901 Long term (current) use of anticoagulants: Secondary | ICD-10-CM

## 2017-06-01 DIAGNOSIS — I4891 Unspecified atrial fibrillation: Secondary | ICD-10-CM | POA: Diagnosis not present

## 2017-06-01 DIAGNOSIS — G459 Transient cerebral ischemic attack, unspecified: Secondary | ICD-10-CM | POA: Diagnosis not present

## 2017-06-01 DIAGNOSIS — Z5181 Encounter for therapeutic drug level monitoring: Secondary | ICD-10-CM | POA: Diagnosis not present

## 2017-06-01 LAB — POCT INR: INR: 2.9

## 2017-06-01 NOTE — Patient Instructions (Signed)
Continue coumadin 1 1/2 tablets daily except 1 tablet on Tuesdays, Thursdays and Saturdays.   Recheck in 6 weeks

## 2017-06-05 DIAGNOSIS — R197 Diarrhea, unspecified: Secondary | ICD-10-CM | POA: Diagnosis not present

## 2017-06-05 DIAGNOSIS — F0150 Vascular dementia without behavioral disturbance: Secondary | ICD-10-CM | POA: Diagnosis not present

## 2017-06-05 DIAGNOSIS — I10 Essential (primary) hypertension: Secondary | ICD-10-CM | POA: Diagnosis not present

## 2017-06-05 DIAGNOSIS — N400 Enlarged prostate without lower urinary tract symptoms: Secondary | ICD-10-CM | POA: Diagnosis not present

## 2017-06-05 DIAGNOSIS — R404 Transient alteration of awareness: Secondary | ICD-10-CM | POA: Diagnosis not present

## 2017-06-05 DIAGNOSIS — E119 Type 2 diabetes mellitus without complications: Secondary | ICD-10-CM | POA: Diagnosis not present

## 2017-06-05 DIAGNOSIS — R269 Unspecified abnormalities of gait and mobility: Secondary | ICD-10-CM | POA: Diagnosis not present

## 2017-06-05 DIAGNOSIS — I4891 Unspecified atrial fibrillation: Secondary | ICD-10-CM | POA: Diagnosis not present

## 2017-06-05 DIAGNOSIS — I6389 Other cerebral infarction: Secondary | ICD-10-CM | POA: Diagnosis not present

## 2017-06-05 DIAGNOSIS — R4182 Altered mental status, unspecified: Secondary | ICD-10-CM | POA: Diagnosis not present

## 2017-06-05 DIAGNOSIS — R112 Nausea with vomiting, unspecified: Secondary | ICD-10-CM | POA: Diagnosis not present

## 2017-06-05 DIAGNOSIS — I2510 Atherosclerotic heart disease of native coronary artery without angina pectoris: Secondary | ICD-10-CM | POA: Diagnosis not present

## 2017-06-05 DIAGNOSIS — R531 Weakness: Secondary | ICD-10-CM | POA: Diagnosis not present

## 2017-06-05 DIAGNOSIS — E039 Hypothyroidism, unspecified: Secondary | ICD-10-CM | POA: Diagnosis not present

## 2017-06-06 DIAGNOSIS — R4182 Altered mental status, unspecified: Secondary | ICD-10-CM | POA: Diagnosis not present

## 2017-06-06 DIAGNOSIS — R269 Unspecified abnormalities of gait and mobility: Secondary | ICD-10-CM | POA: Diagnosis not present

## 2017-06-06 DIAGNOSIS — I10 Essential (primary) hypertension: Secondary | ICD-10-CM | POA: Diagnosis not present

## 2017-06-06 DIAGNOSIS — F0150 Vascular dementia without behavioral disturbance: Secondary | ICD-10-CM | POA: Diagnosis not present

## 2017-06-06 DIAGNOSIS — I2510 Atherosclerotic heart disease of native coronary artery without angina pectoris: Secondary | ICD-10-CM | POA: Diagnosis not present

## 2017-06-06 DIAGNOSIS — I4891 Unspecified atrial fibrillation: Secondary | ICD-10-CM | POA: Diagnosis not present

## 2017-06-06 DIAGNOSIS — E119 Type 2 diabetes mellitus without complications: Secondary | ICD-10-CM | POA: Diagnosis not present

## 2017-06-06 DIAGNOSIS — E039 Hypothyroidism, unspecified: Secondary | ICD-10-CM | POA: Diagnosis not present

## 2017-06-06 DIAGNOSIS — I6389 Other cerebral infarction: Secondary | ICD-10-CM | POA: Diagnosis not present

## 2017-06-06 DIAGNOSIS — N400 Enlarged prostate without lower urinary tract symptoms: Secondary | ICD-10-CM | POA: Diagnosis not present

## 2017-06-07 DIAGNOSIS — E119 Type 2 diabetes mellitus without complications: Secondary | ICD-10-CM | POA: Diagnosis not present

## 2017-06-07 DIAGNOSIS — R4182 Altered mental status, unspecified: Secondary | ICD-10-CM | POA: Diagnosis not present

## 2017-06-07 DIAGNOSIS — I4891 Unspecified atrial fibrillation: Secondary | ICD-10-CM | POA: Diagnosis not present

## 2017-06-07 DIAGNOSIS — R269 Unspecified abnormalities of gait and mobility: Secondary | ICD-10-CM | POA: Diagnosis not present

## 2017-06-07 DIAGNOSIS — W19XXXA Unspecified fall, initial encounter: Secondary | ICD-10-CM | POA: Diagnosis not present

## 2017-06-07 DIAGNOSIS — N400 Enlarged prostate without lower urinary tract symptoms: Secondary | ICD-10-CM | POA: Diagnosis not present

## 2017-06-07 DIAGNOSIS — F0150 Vascular dementia without behavioral disturbance: Secondary | ICD-10-CM | POA: Diagnosis not present

## 2017-06-07 DIAGNOSIS — I2510 Atherosclerotic heart disease of native coronary artery without angina pectoris: Secondary | ICD-10-CM | POA: Diagnosis not present

## 2017-06-07 DIAGNOSIS — I639 Cerebral infarction, unspecified: Secondary | ICD-10-CM | POA: Diagnosis not present

## 2017-06-07 DIAGNOSIS — E039 Hypothyroidism, unspecified: Secondary | ICD-10-CM | POA: Diagnosis not present

## 2017-06-07 DIAGNOSIS — I10 Essential (primary) hypertension: Secondary | ICD-10-CM | POA: Diagnosis not present

## 2017-06-07 DIAGNOSIS — I6389 Other cerebral infarction: Secondary | ICD-10-CM | POA: Diagnosis not present

## 2017-06-08 DIAGNOSIS — I6389 Other cerebral infarction: Secondary | ICD-10-CM | POA: Diagnosis not present

## 2017-06-08 DIAGNOSIS — M6281 Muscle weakness (generalized): Secondary | ICD-10-CM | POA: Diagnosis not present

## 2017-06-08 DIAGNOSIS — I4891 Unspecified atrial fibrillation: Secondary | ICD-10-CM | POA: Diagnosis not present

## 2017-06-08 DIAGNOSIS — R1312 Dysphagia, oropharyngeal phase: Secondary | ICD-10-CM | POA: Diagnosis not present

## 2017-06-08 DIAGNOSIS — R262 Difficulty in walking, not elsewhere classified: Secondary | ICD-10-CM | POA: Diagnosis not present

## 2017-06-08 DIAGNOSIS — I10 Essential (primary) hypertension: Secondary | ICD-10-CM | POA: Diagnosis not present

## 2017-06-08 DIAGNOSIS — N400 Enlarged prostate without lower urinary tract symptoms: Secondary | ICD-10-CM | POA: Diagnosis not present

## 2017-06-08 DIAGNOSIS — F0150 Vascular dementia without behavioral disturbance: Secondary | ICD-10-CM | POA: Diagnosis not present

## 2017-06-08 DIAGNOSIS — I639 Cerebral infarction, unspecified: Secondary | ICD-10-CM | POA: Diagnosis not present

## 2017-06-08 DIAGNOSIS — N189 Chronic kidney disease, unspecified: Secondary | ICD-10-CM | POA: Diagnosis not present

## 2017-06-08 DIAGNOSIS — J209 Acute bronchitis, unspecified: Secondary | ICD-10-CM | POA: Diagnosis not present

## 2017-06-08 DIAGNOSIS — R351 Nocturia: Secondary | ICD-10-CM | POA: Diagnosis not present

## 2017-06-08 DIAGNOSIS — E1122 Type 2 diabetes mellitus with diabetic chronic kidney disease: Secondary | ICD-10-CM | POA: Diagnosis not present

## 2017-06-08 DIAGNOSIS — N401 Enlarged prostate with lower urinary tract symptoms: Secondary | ICD-10-CM | POA: Diagnosis not present

## 2017-06-08 DIAGNOSIS — R350 Frequency of micturition: Secondary | ICD-10-CM | POA: Diagnosis not present

## 2017-06-08 DIAGNOSIS — J069 Acute upper respiratory infection, unspecified: Secondary | ICD-10-CM | POA: Diagnosis not present

## 2017-06-08 DIAGNOSIS — E039 Hypothyroidism, unspecified: Secondary | ICD-10-CM | POA: Diagnosis not present

## 2017-06-08 DIAGNOSIS — K219 Gastro-esophageal reflux disease without esophagitis: Secondary | ICD-10-CM | POA: Diagnosis not present

## 2017-06-08 DIAGNOSIS — Z8679 Personal history of other diseases of the circulatory system: Secondary | ICD-10-CM | POA: Diagnosis not present

## 2017-06-08 DIAGNOSIS — I1310 Hypertensive heart and chronic kidney disease without heart failure, with stage 1 through stage 4 chronic kidney disease, or unspecified chronic kidney disease: Secondary | ICD-10-CM | POA: Diagnosis not present

## 2017-06-08 DIAGNOSIS — R2689 Other abnormalities of gait and mobility: Secondary | ICD-10-CM | POA: Diagnosis not present

## 2017-06-08 DIAGNOSIS — E119 Type 2 diabetes mellitus without complications: Secondary | ICD-10-CM | POA: Diagnosis not present

## 2017-06-08 DIAGNOSIS — R069 Unspecified abnormalities of breathing: Secondary | ICD-10-CM | POA: Diagnosis not present

## 2017-06-08 DIAGNOSIS — I739 Peripheral vascular disease, unspecified: Secondary | ICD-10-CM | POA: Diagnosis not present

## 2017-06-08 DIAGNOSIS — I2510 Atherosclerotic heart disease of native coronary artery without angina pectoris: Secondary | ICD-10-CM | POA: Diagnosis not present

## 2017-06-08 DIAGNOSIS — R269 Unspecified abnormalities of gait and mobility: Secondary | ICD-10-CM | POA: Diagnosis not present

## 2017-06-08 DIAGNOSIS — I70209 Unspecified atherosclerosis of native arteries of extremities, unspecified extremity: Secondary | ICD-10-CM | POA: Diagnosis not present

## 2017-06-08 DIAGNOSIS — R531 Weakness: Secondary | ICD-10-CM | POA: Diagnosis not present

## 2017-06-08 DIAGNOSIS — E785 Hyperlipidemia, unspecified: Secondary | ICD-10-CM | POA: Diagnosis not present

## 2017-06-08 DIAGNOSIS — I482 Chronic atrial fibrillation: Secondary | ICD-10-CM | POA: Diagnosis not present

## 2017-06-08 DIAGNOSIS — I69391 Dysphagia following cerebral infarction: Secondary | ICD-10-CM | POA: Diagnosis not present

## 2017-06-08 DIAGNOSIS — R0989 Other specified symptoms and signs involving the circulatory and respiratory systems: Secondary | ICD-10-CM | POA: Diagnosis not present

## 2017-06-09 ENCOUNTER — Telehealth: Payer: Self-pay | Admitting: *Deleted

## 2017-06-09 NOTE — Telephone Encounter (Signed)
Please give pt a call about his coumadin

## 2017-06-10 NOTE — Telephone Encounter (Signed)
Called and spoke with wife.  She states Danny Lucas had a TIA on 06/05/17 and was taken by EMS to [facility].  States he was taken off coumadin in the hospital and started on Lovenox 40mg  daily and ASA.  Danny Lucas has been transferred to [facility] for rehab.  He was much better today being able to tie his shoe.  Wife was concerned about coumadin being discontinued.  Tried to reassure her since he is on Lovenox but she wanted me to make sure you got this message.

## 2017-06-11 DIAGNOSIS — I70209 Unspecified atherosclerosis of native arteries of extremities, unspecified extremity: Secondary | ICD-10-CM | POA: Diagnosis not present

## 2017-06-11 DIAGNOSIS — N401 Enlarged prostate with lower urinary tract symptoms: Secondary | ICD-10-CM | POA: Diagnosis not present

## 2017-06-11 DIAGNOSIS — F0150 Vascular dementia without behavioral disturbance: Secondary | ICD-10-CM | POA: Diagnosis not present

## 2017-06-11 DIAGNOSIS — I639 Cerebral infarction, unspecified: Secondary | ICD-10-CM | POA: Diagnosis not present

## 2017-06-13 NOTE — Telephone Encounter (Signed)
Noted.GT 

## 2017-06-22 DIAGNOSIS — R350 Frequency of micturition: Secondary | ICD-10-CM | POA: Diagnosis not present

## 2017-06-25 DIAGNOSIS — E1122 Type 2 diabetes mellitus with diabetic chronic kidney disease: Secondary | ICD-10-CM | POA: Diagnosis not present

## 2017-06-25 DIAGNOSIS — Z8679 Personal history of other diseases of the circulatory system: Secondary | ICD-10-CM | POA: Diagnosis not present

## 2017-06-25 DIAGNOSIS — I1310 Hypertensive heart and chronic kidney disease without heart failure, with stage 1 through stage 4 chronic kidney disease, or unspecified chronic kidney disease: Secondary | ICD-10-CM | POA: Diagnosis not present

## 2017-06-25 DIAGNOSIS — R0989 Other specified symptoms and signs involving the circulatory and respiratory systems: Secondary | ICD-10-CM | POA: Diagnosis not present

## 2017-06-25 DIAGNOSIS — K219 Gastro-esophageal reflux disease without esophagitis: Secondary | ICD-10-CM | POA: Diagnosis not present

## 2017-06-25 DIAGNOSIS — N189 Chronic kidney disease, unspecified: Secondary | ICD-10-CM | POA: Diagnosis not present

## 2017-06-25 DIAGNOSIS — J209 Acute bronchitis, unspecified: Secondary | ICD-10-CM | POA: Diagnosis not present

## 2017-06-25 DIAGNOSIS — J069 Acute upper respiratory infection, unspecified: Secondary | ICD-10-CM | POA: Diagnosis not present

## 2017-06-25 DIAGNOSIS — I10 Essential (primary) hypertension: Secondary | ICD-10-CM | POA: Diagnosis not present

## 2017-06-25 DIAGNOSIS — E785 Hyperlipidemia, unspecified: Secondary | ICD-10-CM | POA: Diagnosis not present

## 2017-06-25 DIAGNOSIS — R2689 Other abnormalities of gait and mobility: Secondary | ICD-10-CM | POA: Diagnosis not present

## 2017-06-29 ENCOUNTER — Telehealth: Payer: Self-pay | Admitting: *Deleted

## 2017-06-29 DIAGNOSIS — I639 Cerebral infarction, unspecified: Secondary | ICD-10-CM | POA: Diagnosis not present

## 2017-06-29 DIAGNOSIS — I482 Chronic atrial fibrillation: Secondary | ICD-10-CM | POA: Diagnosis not present

## 2017-06-29 DIAGNOSIS — R531 Weakness: Secondary | ICD-10-CM | POA: Diagnosis not present

## 2017-06-29 DIAGNOSIS — I10 Essential (primary) hypertension: Secondary | ICD-10-CM | POA: Diagnosis not present

## 2017-06-29 NOTE — Telephone Encounter (Signed)
Danny Lucas called stating that Mr. Ratkovich was discharged from [facility]. States his most recent PT was 1.2  Please call [phone] Olegario Messier)

## 2017-06-29 NOTE — Telephone Encounter (Signed)
Spoke with wife.  Pt has been in [facility] after TIA on 06/05/17.  He was taken off coumadin due to fall risk and increased dementia per wife and started on Lovenox injections while in hospital. He was d/c from hospital and sent to Rehab just on ASA 81mg  daily.  Pt was sent home today.  Wife wants to know if pt should be back on Warfarin.  Explained risk and benefits of both being on and off coumadin at his age with his health condition.  Told her that decision would be between her and his doctor.  She has put in a call to Dr Leandrew Koyanagi to discuss with him.  She will call back tomorrow if she wants me to discuss this with Dr Ladona Ridgel. Wife is appreciative of call back.

## 2017-07-01 DIAGNOSIS — I69398 Other sequelae of cerebral infarction: Secondary | ICD-10-CM | POA: Diagnosis not present

## 2017-07-01 DIAGNOSIS — F0150 Vascular dementia without behavioral disturbance: Secondary | ICD-10-CM | POA: Diagnosis not present

## 2017-07-01 DIAGNOSIS — I129 Hypertensive chronic kidney disease with stage 1 through stage 4 chronic kidney disease, or unspecified chronic kidney disease: Secondary | ICD-10-CM | POA: Diagnosis not present

## 2017-07-01 DIAGNOSIS — E1122 Type 2 diabetes mellitus with diabetic chronic kidney disease: Secondary | ICD-10-CM | POA: Diagnosis not present

## 2017-07-01 DIAGNOSIS — N189 Chronic kidney disease, unspecified: Secondary | ICD-10-CM | POA: Diagnosis not present

## 2017-07-01 DIAGNOSIS — I69354 Hemiplegia and hemiparesis following cerebral infarction affecting left non-dominant side: Secondary | ICD-10-CM | POA: Diagnosis not present

## 2017-07-02 DIAGNOSIS — I129 Hypertensive chronic kidney disease with stage 1 through stage 4 chronic kidney disease, or unspecified chronic kidney disease: Secondary | ICD-10-CM | POA: Diagnosis not present

## 2017-07-02 DIAGNOSIS — I69354 Hemiplegia and hemiparesis following cerebral infarction affecting left non-dominant side: Secondary | ICD-10-CM | POA: Diagnosis not present

## 2017-07-02 DIAGNOSIS — I69398 Other sequelae of cerebral infarction: Secondary | ICD-10-CM | POA: Diagnosis not present

## 2017-07-02 DIAGNOSIS — E1122 Type 2 diabetes mellitus with diabetic chronic kidney disease: Secondary | ICD-10-CM | POA: Diagnosis not present

## 2017-07-02 DIAGNOSIS — N189 Chronic kidney disease, unspecified: Secondary | ICD-10-CM | POA: Diagnosis not present

## 2017-07-02 DIAGNOSIS — F0150 Vascular dementia without behavioral disturbance: Secondary | ICD-10-CM | POA: Diagnosis not present

## 2017-07-07 DIAGNOSIS — N189 Chronic kidney disease, unspecified: Secondary | ICD-10-CM | POA: Diagnosis not present

## 2017-07-07 DIAGNOSIS — E1122 Type 2 diabetes mellitus with diabetic chronic kidney disease: Secondary | ICD-10-CM | POA: Diagnosis not present

## 2017-07-07 DIAGNOSIS — F0150 Vascular dementia without behavioral disturbance: Secondary | ICD-10-CM | POA: Diagnosis not present

## 2017-07-07 DIAGNOSIS — I129 Hypertensive chronic kidney disease with stage 1 through stage 4 chronic kidney disease, or unspecified chronic kidney disease: Secondary | ICD-10-CM | POA: Diagnosis not present

## 2017-07-07 DIAGNOSIS — I69354 Hemiplegia and hemiparesis following cerebral infarction affecting left non-dominant side: Secondary | ICD-10-CM | POA: Diagnosis not present

## 2017-07-07 DIAGNOSIS — I69398 Other sequelae of cerebral infarction: Secondary | ICD-10-CM | POA: Diagnosis not present

## 2017-07-08 ENCOUNTER — Telehealth: Payer: Self-pay | Admitting: Internal Medicine

## 2017-07-08 DIAGNOSIS — N189 Chronic kidney disease, unspecified: Secondary | ICD-10-CM | POA: Diagnosis not present

## 2017-07-08 DIAGNOSIS — I129 Hypertensive chronic kidney disease with stage 1 through stage 4 chronic kidney disease, or unspecified chronic kidney disease: Secondary | ICD-10-CM | POA: Diagnosis not present

## 2017-07-08 DIAGNOSIS — F0150 Vascular dementia without behavioral disturbance: Secondary | ICD-10-CM | POA: Diagnosis not present

## 2017-07-08 DIAGNOSIS — I69398 Other sequelae of cerebral infarction: Secondary | ICD-10-CM | POA: Diagnosis not present

## 2017-07-08 DIAGNOSIS — E1122 Type 2 diabetes mellitus with diabetic chronic kidney disease: Secondary | ICD-10-CM | POA: Diagnosis not present

## 2017-07-08 DIAGNOSIS — I69354 Hemiplegia and hemiparesis following cerebral infarction affecting left non-dominant side: Secondary | ICD-10-CM | POA: Diagnosis not present

## 2017-07-08 NOTE — Telephone Encounter (Signed)
Asking when he needs to start back coumadian Stated that he is home now from nursing facility

## 2017-07-08 NOTE — Telephone Encounter (Signed)
Spoke with wife.  She and Mr Mustard met with Dr Pleas Koch yesterday about restarting coumadin.  Pt and wife have decided to restart coumadin.  Told her to have pt start coumadin at previous home dose (7.70m daily except 57mon Tuesdays, Thursdays and Saturdays) and continue ASA 8165maily thru 07/11/17 then stop.  [facility]17 and call results to me for further orders.  Wife in agreement.

## 2017-07-09 DIAGNOSIS — I69354 Hemiplegia and hemiparesis following cerebral infarction affecting left non-dominant side: Secondary | ICD-10-CM | POA: Diagnosis not present

## 2017-07-09 DIAGNOSIS — I69398 Other sequelae of cerebral infarction: Secondary | ICD-10-CM | POA: Diagnosis not present

## 2017-07-09 DIAGNOSIS — F0150 Vascular dementia without behavioral disturbance: Secondary | ICD-10-CM | POA: Diagnosis not present

## 2017-07-09 DIAGNOSIS — E1122 Type 2 diabetes mellitus with diabetic chronic kidney disease: Secondary | ICD-10-CM | POA: Diagnosis not present

## 2017-07-09 DIAGNOSIS — I129 Hypertensive chronic kidney disease with stage 1 through stage 4 chronic kidney disease, or unspecified chronic kidney disease: Secondary | ICD-10-CM | POA: Diagnosis not present

## 2017-07-09 DIAGNOSIS — N189 Chronic kidney disease, unspecified: Secondary | ICD-10-CM | POA: Diagnosis not present

## 2017-07-13 ENCOUNTER — Ambulatory Visit (INDEPENDENT_AMBULATORY_CARE_PROVIDER_SITE_OTHER): Payer: Medicare PPO | Admitting: *Deleted

## 2017-07-13 DIAGNOSIS — G459 Transient cerebral ischemic attack, unspecified: Secondary | ICD-10-CM | POA: Diagnosis not present

## 2017-07-13 DIAGNOSIS — I129 Hypertensive chronic kidney disease with stage 1 through stage 4 chronic kidney disease, or unspecified chronic kidney disease: Secondary | ICD-10-CM | POA: Diagnosis not present

## 2017-07-13 DIAGNOSIS — I69398 Other sequelae of cerebral infarction: Secondary | ICD-10-CM | POA: Diagnosis not present

## 2017-07-13 DIAGNOSIS — I4891 Unspecified atrial fibrillation: Secondary | ICD-10-CM

## 2017-07-13 DIAGNOSIS — I69354 Hemiplegia and hemiparesis following cerebral infarction affecting left non-dominant side: Secondary | ICD-10-CM | POA: Diagnosis not present

## 2017-07-13 DIAGNOSIS — Z7901 Long term (current) use of anticoagulants: Secondary | ICD-10-CM

## 2017-07-13 DIAGNOSIS — N189 Chronic kidney disease, unspecified: Secondary | ICD-10-CM | POA: Diagnosis not present

## 2017-07-13 DIAGNOSIS — F0150 Vascular dementia without behavioral disturbance: Secondary | ICD-10-CM | POA: Diagnosis not present

## 2017-07-13 DIAGNOSIS — E1122 Type 2 diabetes mellitus with diabetic chronic kidney disease: Secondary | ICD-10-CM | POA: Diagnosis not present

## 2017-07-13 DIAGNOSIS — Z5181 Encounter for therapeutic drug level monitoring: Secondary | ICD-10-CM | POA: Diagnosis not present

## 2017-07-13 LAB — POCT INR: INR: 1.9

## 2017-07-13 NOTE — Patient Instructions (Signed)
Take coumadin 1 1/2 tablets tonight then resume 1 1/2 tablets daily except 1 tablet on Tuesdays, Thursdays and Saturdays.   Recheck in 1 week Order given to [employer] RN [facility] while in home

## 2017-07-15 DIAGNOSIS — N189 Chronic kidney disease, unspecified: Secondary | ICD-10-CM | POA: Diagnosis not present

## 2017-07-15 DIAGNOSIS — I69398 Other sequelae of cerebral infarction: Secondary | ICD-10-CM | POA: Diagnosis not present

## 2017-07-15 DIAGNOSIS — F0150 Vascular dementia without behavioral disturbance: Secondary | ICD-10-CM | POA: Diagnosis not present

## 2017-07-15 DIAGNOSIS — E1122 Type 2 diabetes mellitus with diabetic chronic kidney disease: Secondary | ICD-10-CM | POA: Diagnosis not present

## 2017-07-15 DIAGNOSIS — I129 Hypertensive chronic kidney disease with stage 1 through stage 4 chronic kidney disease, or unspecified chronic kidney disease: Secondary | ICD-10-CM | POA: Diagnosis not present

## 2017-07-15 DIAGNOSIS — I69354 Hemiplegia and hemiparesis following cerebral infarction affecting left non-dominant side: Secondary | ICD-10-CM | POA: Diagnosis not present

## 2017-07-20 DIAGNOSIS — E1122 Type 2 diabetes mellitus with diabetic chronic kidney disease: Secondary | ICD-10-CM | POA: Diagnosis not present

## 2017-07-20 DIAGNOSIS — N189 Chronic kidney disease, unspecified: Secondary | ICD-10-CM | POA: Diagnosis not present

## 2017-07-20 DIAGNOSIS — I69398 Other sequelae of cerebral infarction: Secondary | ICD-10-CM | POA: Diagnosis not present

## 2017-07-20 DIAGNOSIS — F0150 Vascular dementia without behavioral disturbance: Secondary | ICD-10-CM | POA: Diagnosis not present

## 2017-07-20 DIAGNOSIS — I69354 Hemiplegia and hemiparesis following cerebral infarction affecting left non-dominant side: Secondary | ICD-10-CM | POA: Diagnosis not present

## 2017-07-20 DIAGNOSIS — I129 Hypertensive chronic kidney disease with stage 1 through stage 4 chronic kidney disease, or unspecified chronic kidney disease: Secondary | ICD-10-CM | POA: Diagnosis not present

## 2017-07-21 ENCOUNTER — Ambulatory Visit (INDEPENDENT_AMBULATORY_CARE_PROVIDER_SITE_OTHER): Payer: Medicare PPO | Admitting: *Deleted

## 2017-07-21 DIAGNOSIS — I4891 Unspecified atrial fibrillation: Secondary | ICD-10-CM | POA: Diagnosis not present

## 2017-07-21 DIAGNOSIS — Z5181 Encounter for therapeutic drug level monitoring: Secondary | ICD-10-CM

## 2017-07-21 DIAGNOSIS — Z7901 Long term (current) use of anticoagulants: Secondary | ICD-10-CM

## 2017-07-21 DIAGNOSIS — N189 Chronic kidney disease, unspecified: Secondary | ICD-10-CM | POA: Diagnosis not present

## 2017-07-21 DIAGNOSIS — I69354 Hemiplegia and hemiparesis following cerebral infarction affecting left non-dominant side: Secondary | ICD-10-CM | POA: Diagnosis not present

## 2017-07-21 DIAGNOSIS — I69398 Other sequelae of cerebral infarction: Secondary | ICD-10-CM | POA: Diagnosis not present

## 2017-07-21 DIAGNOSIS — I129 Hypertensive chronic kidney disease with stage 1 through stage 4 chronic kidney disease, or unspecified chronic kidney disease: Secondary | ICD-10-CM | POA: Diagnosis not present

## 2017-07-21 DIAGNOSIS — E1122 Type 2 diabetes mellitus with diabetic chronic kidney disease: Secondary | ICD-10-CM | POA: Diagnosis not present

## 2017-07-21 DIAGNOSIS — F0150 Vascular dementia without behavioral disturbance: Secondary | ICD-10-CM | POA: Diagnosis not present

## 2017-07-21 LAB — POCT INR: INR: 4.1

## 2017-07-21 NOTE — Patient Instructions (Signed)
Hold coumadin tonight then resume 1 1/2 tablets daily except 1 tablet on Tuesdays, Thursdays and Saturdays.   Recheck in 1 week Order given to [employer] RN [facility] while in home

## 2017-07-22 DIAGNOSIS — I69398 Other sequelae of cerebral infarction: Secondary | ICD-10-CM | POA: Diagnosis not present

## 2017-07-22 DIAGNOSIS — I69354 Hemiplegia and hemiparesis following cerebral infarction affecting left non-dominant side: Secondary | ICD-10-CM | POA: Diagnosis not present

## 2017-07-22 DIAGNOSIS — F0150 Vascular dementia without behavioral disturbance: Secondary | ICD-10-CM | POA: Diagnosis not present

## 2017-07-22 DIAGNOSIS — I129 Hypertensive chronic kidney disease with stage 1 through stage 4 chronic kidney disease, or unspecified chronic kidney disease: Secondary | ICD-10-CM | POA: Diagnosis not present

## 2017-07-22 DIAGNOSIS — N189 Chronic kidney disease, unspecified: Secondary | ICD-10-CM | POA: Diagnosis not present

## 2017-07-22 DIAGNOSIS — E1122 Type 2 diabetes mellitus with diabetic chronic kidney disease: Secondary | ICD-10-CM | POA: Diagnosis not present

## 2017-07-27 DIAGNOSIS — I69354 Hemiplegia and hemiparesis following cerebral infarction affecting left non-dominant side: Secondary | ICD-10-CM | POA: Diagnosis not present

## 2017-07-27 DIAGNOSIS — F0150 Vascular dementia without behavioral disturbance: Secondary | ICD-10-CM | POA: Diagnosis not present

## 2017-07-27 DIAGNOSIS — I129 Hypertensive chronic kidney disease with stage 1 through stage 4 chronic kidney disease, or unspecified chronic kidney disease: Secondary | ICD-10-CM | POA: Diagnosis not present

## 2017-07-27 DIAGNOSIS — I69398 Other sequelae of cerebral infarction: Secondary | ICD-10-CM | POA: Diagnosis not present

## 2017-07-27 DIAGNOSIS — E1122 Type 2 diabetes mellitus with diabetic chronic kidney disease: Secondary | ICD-10-CM | POA: Diagnosis not present

## 2017-07-27 DIAGNOSIS — N189 Chronic kidney disease, unspecified: Secondary | ICD-10-CM | POA: Diagnosis not present

## 2017-07-28 ENCOUNTER — Telehealth: Payer: Self-pay | Admitting: *Deleted

## 2017-07-28 ENCOUNTER — Ambulatory Visit (INDEPENDENT_AMBULATORY_CARE_PROVIDER_SITE_OTHER): Payer: Medicare PPO | Admitting: *Deleted

## 2017-07-28 DIAGNOSIS — I129 Hypertensive chronic kidney disease with stage 1 through stage 4 chronic kidney disease, or unspecified chronic kidney disease: Secondary | ICD-10-CM | POA: Diagnosis not present

## 2017-07-28 DIAGNOSIS — Z5181 Encounter for therapeutic drug level monitoring: Secondary | ICD-10-CM | POA: Diagnosis not present

## 2017-07-28 DIAGNOSIS — N189 Chronic kidney disease, unspecified: Secondary | ICD-10-CM | POA: Diagnosis not present

## 2017-07-28 DIAGNOSIS — F0150 Vascular dementia without behavioral disturbance: Secondary | ICD-10-CM | POA: Diagnosis not present

## 2017-07-28 DIAGNOSIS — I4891 Unspecified atrial fibrillation: Secondary | ICD-10-CM | POA: Diagnosis not present

## 2017-07-28 DIAGNOSIS — E1122 Type 2 diabetes mellitus with diabetic chronic kidney disease: Secondary | ICD-10-CM | POA: Diagnosis not present

## 2017-07-28 DIAGNOSIS — I69354 Hemiplegia and hemiparesis following cerebral infarction affecting left non-dominant side: Secondary | ICD-10-CM | POA: Diagnosis not present

## 2017-07-28 DIAGNOSIS — I69398 Other sequelae of cerebral infarction: Secondary | ICD-10-CM | POA: Diagnosis not present

## 2017-07-28 LAB — POCT INR: INR: 2.8

## 2017-07-28 NOTE — Telephone Encounter (Signed)
Please give pt's wife a call @ [phone]

## 2017-07-28 NOTE — Telephone Encounter (Signed)
Called and spoke with wife.  Patient wants to come into the office for INR next week instead of Home Health Nurse checking it.  They had some type of conflict issues at today's visit.  Assured wife that would be fine and INR appt made for 08/03/17.

## 2017-07-28 NOTE — Patient Instructions (Signed)
Continue coumadin 1 1/2 tablets daily except 1 tablet on Tuesdays, Thursdays and Saturdays.   Recheck in 1 week Order given to [employer] RN [facility] while in home

## 2017-07-28 NOTE — Telephone Encounter (Signed)
Wife states that [facility] and the visit did not go well. His INR was 2.8 and she would like to know what his dosing will be and and wants his next appt. To be in [place].

## 2017-07-29 DIAGNOSIS — F0151 Vascular dementia with behavioral disturbance: Secondary | ICD-10-CM | POA: Diagnosis not present

## 2017-07-29 DIAGNOSIS — N182 Chronic kidney disease, stage 2 (mild): Secondary | ICD-10-CM | POA: Diagnosis not present

## 2017-07-29 DIAGNOSIS — I69354 Hemiplegia and hemiparesis following cerebral infarction affecting left non-dominant side: Secondary | ICD-10-CM | POA: Diagnosis not present

## 2017-07-29 DIAGNOSIS — E1122 Type 2 diabetes mellitus with diabetic chronic kidney disease: Secondary | ICD-10-CM | POA: Diagnosis not present

## 2017-07-29 DIAGNOSIS — R531 Weakness: Secondary | ICD-10-CM | POA: Diagnosis not present

## 2017-07-29 DIAGNOSIS — F0150 Vascular dementia without behavioral disturbance: Secondary | ICD-10-CM | POA: Diagnosis not present

## 2017-07-29 DIAGNOSIS — J209 Acute bronchitis, unspecified: Secondary | ICD-10-CM | POA: Diagnosis not present

## 2017-07-29 DIAGNOSIS — I69398 Other sequelae of cerebral infarction: Secondary | ICD-10-CM | POA: Diagnosis not present

## 2017-07-29 DIAGNOSIS — I129 Hypertensive chronic kidney disease with stage 1 through stage 4 chronic kidney disease, or unspecified chronic kidney disease: Secondary | ICD-10-CM | POA: Diagnosis not present

## 2017-07-29 DIAGNOSIS — E1143 Type 2 diabetes mellitus with diabetic autonomic (poly)neuropathy: Secondary | ICD-10-CM | POA: Diagnosis not present

## 2017-07-29 DIAGNOSIS — N189 Chronic kidney disease, unspecified: Secondary | ICD-10-CM | POA: Diagnosis not present

## 2017-07-29 DIAGNOSIS — R4182 Altered mental status, unspecified: Secondary | ICD-10-CM | POA: Diagnosis not present

## 2017-07-29 DIAGNOSIS — R2681 Unsteadiness on feet: Secondary | ICD-10-CM | POA: Diagnosis not present

## 2017-08-03 ENCOUNTER — Ambulatory Visit (INDEPENDENT_AMBULATORY_CARE_PROVIDER_SITE_OTHER): Payer: Medicare PPO | Admitting: *Deleted

## 2017-08-03 DIAGNOSIS — I4891 Unspecified atrial fibrillation: Secondary | ICD-10-CM | POA: Diagnosis not present

## 2017-08-03 DIAGNOSIS — G459 Transient cerebral ischemic attack, unspecified: Secondary | ICD-10-CM | POA: Diagnosis not present

## 2017-08-03 DIAGNOSIS — I129 Hypertensive chronic kidney disease with stage 1 through stage 4 chronic kidney disease, or unspecified chronic kidney disease: Secondary | ICD-10-CM | POA: Diagnosis not present

## 2017-08-03 DIAGNOSIS — E1122 Type 2 diabetes mellitus with diabetic chronic kidney disease: Secondary | ICD-10-CM | POA: Diagnosis not present

## 2017-08-03 DIAGNOSIS — I69354 Hemiplegia and hemiparesis following cerebral infarction affecting left non-dominant side: Secondary | ICD-10-CM | POA: Diagnosis not present

## 2017-08-03 DIAGNOSIS — I69398 Other sequelae of cerebral infarction: Secondary | ICD-10-CM | POA: Diagnosis not present

## 2017-08-03 DIAGNOSIS — Z7901 Long term (current) use of anticoagulants: Secondary | ICD-10-CM

## 2017-08-03 DIAGNOSIS — F0150 Vascular dementia without behavioral disturbance: Secondary | ICD-10-CM | POA: Diagnosis not present

## 2017-08-03 DIAGNOSIS — Z5181 Encounter for therapeutic drug level monitoring: Secondary | ICD-10-CM | POA: Diagnosis not present

## 2017-08-03 DIAGNOSIS — N189 Chronic kidney disease, unspecified: Secondary | ICD-10-CM | POA: Diagnosis not present

## 2017-08-03 LAB — POCT INR: INR: 3.7

## 2017-08-03 NOTE — Patient Instructions (Signed)
Hold coumadin tonight then decrease dose to 1 tablet daily except 1 1/2 tablets on Tuesdays and Fridays Recheck in 2 weeks 

## 2017-08-05 DIAGNOSIS — I69354 Hemiplegia and hemiparesis following cerebral infarction affecting left non-dominant side: Secondary | ICD-10-CM | POA: Diagnosis not present

## 2017-08-05 DIAGNOSIS — F0150 Vascular dementia without behavioral disturbance: Secondary | ICD-10-CM | POA: Diagnosis not present

## 2017-08-05 DIAGNOSIS — E1122 Type 2 diabetes mellitus with diabetic chronic kidney disease: Secondary | ICD-10-CM | POA: Diagnosis not present

## 2017-08-05 DIAGNOSIS — I69398 Other sequelae of cerebral infarction: Secondary | ICD-10-CM | POA: Diagnosis not present

## 2017-08-05 DIAGNOSIS — I129 Hypertensive chronic kidney disease with stage 1 through stage 4 chronic kidney disease, or unspecified chronic kidney disease: Secondary | ICD-10-CM | POA: Diagnosis not present

## 2017-08-05 DIAGNOSIS — N189 Chronic kidney disease, unspecified: Secondary | ICD-10-CM | POA: Diagnosis not present

## 2017-08-10 DIAGNOSIS — I69354 Hemiplegia and hemiparesis following cerebral infarction affecting left non-dominant side: Secondary | ICD-10-CM | POA: Diagnosis not present

## 2017-08-10 DIAGNOSIS — I129 Hypertensive chronic kidney disease with stage 1 through stage 4 chronic kidney disease, or unspecified chronic kidney disease: Secondary | ICD-10-CM | POA: Diagnosis not present

## 2017-08-10 DIAGNOSIS — F0150 Vascular dementia without behavioral disturbance: Secondary | ICD-10-CM | POA: Diagnosis not present

## 2017-08-10 DIAGNOSIS — N189 Chronic kidney disease, unspecified: Secondary | ICD-10-CM | POA: Diagnosis not present

## 2017-08-10 DIAGNOSIS — I69398 Other sequelae of cerebral infarction: Secondary | ICD-10-CM | POA: Diagnosis not present

## 2017-08-10 DIAGNOSIS — E1122 Type 2 diabetes mellitus with diabetic chronic kidney disease: Secondary | ICD-10-CM | POA: Diagnosis not present

## 2017-08-12 DIAGNOSIS — I69354 Hemiplegia and hemiparesis following cerebral infarction affecting left non-dominant side: Secondary | ICD-10-CM | POA: Diagnosis not present

## 2017-08-12 DIAGNOSIS — F0150 Vascular dementia without behavioral disturbance: Secondary | ICD-10-CM | POA: Diagnosis not present

## 2017-08-12 DIAGNOSIS — I129 Hypertensive chronic kidney disease with stage 1 through stage 4 chronic kidney disease, or unspecified chronic kidney disease: Secondary | ICD-10-CM | POA: Diagnosis not present

## 2017-08-12 DIAGNOSIS — N189 Chronic kidney disease, unspecified: Secondary | ICD-10-CM | POA: Diagnosis not present

## 2017-08-12 DIAGNOSIS — I69398 Other sequelae of cerebral infarction: Secondary | ICD-10-CM | POA: Diagnosis not present

## 2017-08-12 DIAGNOSIS — E1122 Type 2 diabetes mellitus with diabetic chronic kidney disease: Secondary | ICD-10-CM | POA: Diagnosis not present

## 2017-08-16 DIAGNOSIS — F0150 Vascular dementia without behavioral disturbance: Secondary | ICD-10-CM | POA: Diagnosis not present

## 2017-08-16 DIAGNOSIS — E1122 Type 2 diabetes mellitus with diabetic chronic kidney disease: Secondary | ICD-10-CM | POA: Diagnosis not present

## 2017-08-16 DIAGNOSIS — I69354 Hemiplegia and hemiparesis following cerebral infarction affecting left non-dominant side: Secondary | ICD-10-CM | POA: Diagnosis not present

## 2017-08-16 DIAGNOSIS — I69398 Other sequelae of cerebral infarction: Secondary | ICD-10-CM | POA: Diagnosis not present

## 2017-08-16 DIAGNOSIS — I129 Hypertensive chronic kidney disease with stage 1 through stage 4 chronic kidney disease, or unspecified chronic kidney disease: Secondary | ICD-10-CM | POA: Diagnosis not present

## 2017-08-16 DIAGNOSIS — N189 Chronic kidney disease, unspecified: Secondary | ICD-10-CM | POA: Diagnosis not present

## 2017-08-17 ENCOUNTER — Ambulatory Visit (INDEPENDENT_AMBULATORY_CARE_PROVIDER_SITE_OTHER): Payer: Medicare PPO | Admitting: *Deleted

## 2017-08-17 DIAGNOSIS — E1143 Type 2 diabetes mellitus with diabetic autonomic (poly)neuropathy: Secondary | ICD-10-CM | POA: Diagnosis not present

## 2017-08-17 DIAGNOSIS — I4891 Unspecified atrial fibrillation: Secondary | ICD-10-CM

## 2017-08-17 DIAGNOSIS — Z6823 Body mass index (BMI) 23.0-23.9, adult: Secondary | ICD-10-CM | POA: Diagnosis not present

## 2017-08-17 DIAGNOSIS — Z7901 Long term (current) use of anticoagulants: Secondary | ICD-10-CM | POA: Diagnosis not present

## 2017-08-17 DIAGNOSIS — Z1331 Encounter for screening for depression: Secondary | ICD-10-CM | POA: Diagnosis not present

## 2017-08-17 DIAGNOSIS — J209 Acute bronchitis, unspecified: Secondary | ICD-10-CM | POA: Diagnosis not present

## 2017-08-17 DIAGNOSIS — E1122 Type 2 diabetes mellitus with diabetic chronic kidney disease: Secondary | ICD-10-CM | POA: Diagnosis not present

## 2017-08-17 DIAGNOSIS — Z5181 Encounter for therapeutic drug level monitoring: Secondary | ICD-10-CM

## 2017-08-17 DIAGNOSIS — Z1389 Encounter for screening for other disorder: Secondary | ICD-10-CM | POA: Diagnosis not present

## 2017-08-17 DIAGNOSIS — N182 Chronic kidney disease, stage 2 (mild): Secondary | ICD-10-CM | POA: Diagnosis not present

## 2017-08-17 DIAGNOSIS — G459 Transient cerebral ischemic attack, unspecified: Secondary | ICD-10-CM | POA: Diagnosis not present

## 2017-08-17 DIAGNOSIS — R4182 Altered mental status, unspecified: Secondary | ICD-10-CM | POA: Diagnosis not present

## 2017-08-17 DIAGNOSIS — Z0001 Encounter for general adult medical examination with abnormal findings: Secondary | ICD-10-CM | POA: Diagnosis not present

## 2017-08-17 LAB — POCT INR: INR: 2.6

## 2017-08-17 MED ORDER — WARFARIN SODIUM 5 MG PO TABS: ORAL_TABLET | ORAL | 3 refills | Status: DC

## 2017-08-17 MED ORDER — METOPROLOL TARTRATE 25 MG PO TABS: 25.0000 mg | ORAL_TABLET | Freq: Two times a day (BID) | ORAL | 3 refills | Status: DC

## 2017-08-17 NOTE — Patient Instructions (Signed)
Continue coumadin 1 tablet daily except 1 1/2 tablets on Tuesdays and Fridays Recheck in 4 weeks   

## 2017-08-20 DIAGNOSIS — F0150 Vascular dementia without behavioral disturbance: Secondary | ICD-10-CM | POA: Diagnosis not present

## 2017-08-20 DIAGNOSIS — I129 Hypertensive chronic kidney disease with stage 1 through stage 4 chronic kidney disease, or unspecified chronic kidney disease: Secondary | ICD-10-CM | POA: Diagnosis not present

## 2017-08-20 DIAGNOSIS — N189 Chronic kidney disease, unspecified: Secondary | ICD-10-CM | POA: Diagnosis not present

## 2017-08-20 DIAGNOSIS — I69354 Hemiplegia and hemiparesis following cerebral infarction affecting left non-dominant side: Secondary | ICD-10-CM | POA: Diagnosis not present

## 2017-08-20 DIAGNOSIS — I69398 Other sequelae of cerebral infarction: Secondary | ICD-10-CM | POA: Diagnosis not present

## 2017-08-20 DIAGNOSIS — E1122 Type 2 diabetes mellitus with diabetic chronic kidney disease: Secondary | ICD-10-CM | POA: Diagnosis not present

## 2017-08-25 DIAGNOSIS — N189 Chronic kidney disease, unspecified: Secondary | ICD-10-CM | POA: Diagnosis not present

## 2017-08-25 DIAGNOSIS — F0150 Vascular dementia without behavioral disturbance: Secondary | ICD-10-CM | POA: Diagnosis not present

## 2017-08-25 DIAGNOSIS — I69354 Hemiplegia and hemiparesis following cerebral infarction affecting left non-dominant side: Secondary | ICD-10-CM | POA: Diagnosis not present

## 2017-08-25 DIAGNOSIS — I69398 Other sequelae of cerebral infarction: Secondary | ICD-10-CM | POA: Diagnosis not present

## 2017-08-25 DIAGNOSIS — E1122 Type 2 diabetes mellitus with diabetic chronic kidney disease: Secondary | ICD-10-CM | POA: Diagnosis not present

## 2017-08-25 DIAGNOSIS — I129 Hypertensive chronic kidney disease with stage 1 through stage 4 chronic kidney disease, or unspecified chronic kidney disease: Secondary | ICD-10-CM | POA: Diagnosis not present

## 2017-09-02 ENCOUNTER — Ambulatory Visit: Payer: Medicare PPO | Admitting: Internal Medicine

## 2017-09-02 ENCOUNTER — Encounter: Payer: Self-pay | Admitting: Internal Medicine

## 2017-09-02 VITALS — BP 126/63 | Ht 69.0 in | Wt 155.0 lb

## 2017-09-02 DIAGNOSIS — I480 Paroxysmal atrial fibrillation: Secondary | ICD-10-CM

## 2017-09-02 NOTE — Progress Notes (Signed)
HPI Danny Lucas returns today for ongoing evaluation and management of his atrial fib. He has been in the hospital with bronchitis and atrial fib. He has fallen twice. He has not had syncope. He denies chest pain but his energy level is poor. He has become increasingly less mobile.  Allergies  Allergen Reactions  . Amiodarone Hcl     Lost strength in legs.  . Gatifloxacin Other (See Comments)    unknown     Current Outpatient Medications  Medication Sig Dispense Refill  . ACCU-CHEK SMARTVIEW test strip Use as directed    . doxazosin (CARDURA) 4 MG tablet Take 4 mg by mouth at bedtime.      . fluticasone (FLONASE) 50 MCG/ACT nasal spray Place 1 spray into the nose daily as needed for allergies.     Marland Kitchen levothyroxine (SYNTHROID, LEVOTHROID) 75 MCG tablet Take 75 mcg by mouth. TAKE 1/2 TABLET DAILY     . lisinopril (PRINIVIL,ZESTRIL) 5 MG tablet Take 5 mg by mouth daily.    . metoprolol tartrate (LOPRESSOR) 25 MG tablet Take 1 tablet (25 mg total) by mouth 2 (two) times daily. 180 tablet 3  . tamsulosin (FLOMAX) 0.4 MG CAPS capsule Take 0.4 mg by mouth daily.    Marland Kitchen warfarin (COUMADIN) 5 MG tablet TAKE 1 AND 1/2 TABLETS DAILY OR AS DIRECTED 135 tablet 3   No current facility-administered medications for this visit.      Past Medical History:  Diagnosis Date  . Atrial fibrillation (HCC)   . BPH (benign prostatic hypertrophy)   . Carotid artery occlusion    carotid artery stenosis  . Dysphagia   . GERD (gastroesophageal reflux disease)   . History of kidney stones   . Hypertension   . Hypothyroidism   . Myocardial infarction (HCC)   . PONV (postoperative nausea and vomiting)   . Transient ischemic attack     ROS:   All systems reviewed and negative except as noted in the HPI.   Past Surgical History:  Procedure Laterality Date  . BACK SURGERY  [address] in [place], [place]  . CHOLECYSTECTOMY    . COLONOSCOPY    . CORONARY ARTERY BYPASS GRAFT     coronary  artery bypass grafting time 6 with left internal mammery to ythe left anterior descending coronary artery ,reverse saphenous vein graft to the intermediate coronary artery sequential reverse saphenous vein graft to the first obtuse marginal and distal circumflex,sequential reverse saphenous vein graft to the acute  marginal and posterior descending coronary artery branch of the right corh  . ESOPHAGOGASTRODUODENOSCOPY (EGD) WITH ESOPHAGEAL DILATION N/A 08/29/2012   Procedure: ESOPHAGOGASTRODUODENOSCOPY (EGD) WITH ESOPHAGEAL DILATION;  Surgeon: Malissa Hippo, MD;  Location[facility];  Service: Endoscopy;  Laterality: N/A;  1200  . HERNIA REPAIR Right 2006   [facility]  . UPPER GASTROINTESTINAL ENDOSCOPY       Family History  Problem Relation Age of Onset  . Heart Problems Mother      Social History   Socioeconomic History  . Marital status: Married    Spouse name: Danny Lucas  . Number of children: 2  . Years of education: 7 th  . Highest education level: Not on file  Occupational History    Employer: RETIRED    Comment: retired  Engineer, production  . Financial resource strain: Not on file  . Food insecurity:    Worry: Not on file    Inability: Not on file  .  Transportation needs:    Medical: Not on file    Non-medical: Not on file  Tobacco Use  . Smoking status: Former Smoker    Packs/day: 3.00    Years: 30.00    Pack years: 90.00    Types: Cigarettes    Last attempt to quit: 05/20/1960    Years since quitting: 57.3  . Smokeless tobacco: Never Used  . Tobacco comment: Patient quit smoking in 1962  Substance and Sexual Activity  . Alcohol use: No    Alcohol/week: 0.0 oz  . Drug use: No  . Sexual activity: Not on file  Lifestyle  . Physical activity:    Days per week: Not on file    Minutes per session: Not on file  . Stress: Not on file  Relationships  . Social connections:    Talks on phone: Not on file    Gets together: Not on file    Attends religious service:  Not on file    Active member of club or organization: Not on file    Attends meetings of clubs or organizations: Not on file    Relationship status: Not on file  . Intimate partner violence:    Fear of current or ex partner: Not on file    Emotionally abused: Not on file    Physically abused: Not on file    Forced sexual activity: Not on file  Other Topics Concern  . Not on file  Social History Narrative   Patient  Lives at home with his wife Danny Lucas)   Retired   Education 7 th grade   Right handed   Caffeine none     BP 126/63   Ht 5\' 9"  (1.753 m)   Wt 155 lb (70.3 kg)   BMI 22.89 kg/m   Physical Exam:  Well appearing 84 yo man, NAD HEENT: Unremarkable Neck:  6 cm JVD, no thyromegally Lymphatics:  No adenopathy Back:  No CVA tenderness Lungs:  Clear with no wheezes HEART:  Regular rate rhythm, no murmurs, no rubs, no clicks Abd:  soft, positive bowel sounds, no organomegally, no rebound, no guarding Ext:  2 plus pulses, no edema, no cyanosis, no clubbing Skin:  No rashes no nodules Neuro:  CN II through XII intact, motor grossly intact  EKG - nsr   Assess/Plan: 1. PAF - he is maintaining NSR. He will continue his current meds. 2. HTN - his blood pressure is controlled. He will continue his current meds. 3. Coags - we discussed stopping his systemic anti-coagulation as he has had 2 falls. He has not injured himself. His stroke risk is high. If he continues to fall I would recommend stopping his warfarin. He is not interested in switching to an OAC.  Leonia Reeves.D.

## 2017-09-02 NOTE — Patient Instructions (Signed)
Medication Instructions:  Your physician recommends that you continue on your current medications as directed. Please refer to the Current Medication list given to you today.   Labwork: NONE   Testing/Procedures: NONE   Follow-Up: Your physician wants you to follow-up in: 1 Year with Dr. Taylor. You will receive a reminder letter in the mail two months in advance. If you don't receive a letter, please call our office to schedule the follow-up appointment.   Any Other Special Instructions Will Be Listed Below (If Applicable).     If you need a refill on your cardiac medications before your next appointment, please call your pharmacy.  Thank you for choosing [facility]!   

## 2017-09-14 ENCOUNTER — Ambulatory Visit (INDEPENDENT_AMBULATORY_CARE_PROVIDER_SITE_OTHER): Payer: Medicare PPO | Admitting: *Deleted

## 2017-09-14 DIAGNOSIS — I4891 Unspecified atrial fibrillation: Secondary | ICD-10-CM | POA: Diagnosis not present

## 2017-09-14 DIAGNOSIS — Z5181 Encounter for therapeutic drug level monitoring: Secondary | ICD-10-CM | POA: Diagnosis not present

## 2017-09-14 LAB — POCT INR: INR: 2

## 2017-09-14 NOTE — Patient Instructions (Signed)
Take coumadin 2 tablets tonight then resume 1 tablet daily except 1 1/2 tablets on Tuesdays and Fridays.   Recheck in 4 weeks

## 2017-10-11 DIAGNOSIS — B351 Tinea unguium: Secondary | ICD-10-CM | POA: Diagnosis not present

## 2017-10-11 DIAGNOSIS — M79676 Pain in unspecified toe(s): Secondary | ICD-10-CM | POA: Diagnosis not present

## 2017-10-12 ENCOUNTER — Ambulatory Visit (INDEPENDENT_AMBULATORY_CARE_PROVIDER_SITE_OTHER): Payer: Medicare PPO | Admitting: *Deleted

## 2017-10-12 DIAGNOSIS — I4891 Unspecified atrial fibrillation: Secondary | ICD-10-CM | POA: Diagnosis not present

## 2017-10-12 DIAGNOSIS — Z5181 Encounter for therapeutic drug level monitoring: Secondary | ICD-10-CM

## 2017-10-12 LAB — POCT INR: INR: 2.6 (ref 2.0–3.0)

## 2017-10-12 NOTE — Patient Instructions (Signed)
Continue coumadin 1 tablet daily except 1 1/2 tablets on Tuesdays and Fridays Recheck in 6 weeks 

## 2017-10-14 DIAGNOSIS — E782 Mixed hyperlipidemia: Secondary | ICD-10-CM | POA: Diagnosis not present

## 2017-10-14 DIAGNOSIS — N189 Chronic kidney disease, unspecified: Secondary | ICD-10-CM | POA: Diagnosis not present

## 2017-10-14 DIAGNOSIS — E1122 Type 2 diabetes mellitus with diabetic chronic kidney disease: Secondary | ICD-10-CM | POA: Diagnosis not present

## 2017-10-14 DIAGNOSIS — I482 Chronic atrial fibrillation: Secondary | ICD-10-CM | POA: Diagnosis not present

## 2017-10-14 DIAGNOSIS — E039 Hypothyroidism, unspecified: Secondary | ICD-10-CM | POA: Diagnosis not present

## 2017-10-18 DIAGNOSIS — E1143 Type 2 diabetes mellitus with diabetic autonomic (poly)neuropathy: Secondary | ICD-10-CM | POA: Diagnosis not present

## 2017-10-18 DIAGNOSIS — E1122 Type 2 diabetes mellitus with diabetic chronic kidney disease: Secondary | ICD-10-CM | POA: Diagnosis not present

## 2017-10-18 DIAGNOSIS — E870 Hyperosmolality and hypernatremia: Secondary | ICD-10-CM | POA: Diagnosis not present

## 2017-10-18 DIAGNOSIS — R531 Weakness: Secondary | ICD-10-CM | POA: Diagnosis not present

## 2017-10-18 DIAGNOSIS — Z6822 Body mass index (BMI) 22.0-22.9, adult: Secondary | ICD-10-CM | POA: Diagnosis not present

## 2017-10-18 DIAGNOSIS — R2681 Unsteadiness on feet: Secondary | ICD-10-CM | POA: Diagnosis not present

## 2017-10-18 DIAGNOSIS — N182 Chronic kidney disease, stage 2 (mild): Secondary | ICD-10-CM | POA: Diagnosis not present

## 2017-10-18 DIAGNOSIS — E782 Mixed hyperlipidemia: Secondary | ICD-10-CM | POA: Diagnosis not present

## 2017-11-02 DIAGNOSIS — N182 Chronic kidney disease, stage 2 (mild): Secondary | ICD-10-CM | POA: Diagnosis not present

## 2017-11-02 DIAGNOSIS — I82509 Chronic embolism and thrombosis of unspecified deep veins of unspecified lower extremity: Secondary | ICD-10-CM | POA: Diagnosis not present

## 2017-11-23 ENCOUNTER — Ambulatory Visit (INDEPENDENT_AMBULATORY_CARE_PROVIDER_SITE_OTHER): Payer: Medicare PPO | Admitting: *Deleted

## 2017-11-23 DIAGNOSIS — I4891 Unspecified atrial fibrillation: Secondary | ICD-10-CM

## 2017-11-23 DIAGNOSIS — Z5181 Encounter for therapeutic drug level monitoring: Secondary | ICD-10-CM | POA: Diagnosis not present

## 2017-11-23 LAB — POCT INR: INR: 1.9 — AB (ref 2.0–3.0)

## 2017-11-23 NOTE — Patient Instructions (Signed)
Description   Today July 16th take 2 tablets then continue coumadin 1 tablet daily except 1 1/2 tablets on Tuesdays and Fridays.   Recheck in 3 weeks

## 2017-12-21 ENCOUNTER — Ambulatory Visit (INDEPENDENT_AMBULATORY_CARE_PROVIDER_SITE_OTHER): Payer: Medicare PPO | Admitting: *Deleted

## 2017-12-21 DIAGNOSIS — I4891 Unspecified atrial fibrillation: Secondary | ICD-10-CM

## 2017-12-21 DIAGNOSIS — Z5181 Encounter for therapeutic drug level monitoring: Secondary | ICD-10-CM

## 2017-12-21 LAB — POCT INR: INR: 2.5 (ref 2.0–3.0)

## 2017-12-21 NOTE — Patient Instructions (Signed)
Continue coumadin 1 tablet daily except 1 1/2 tablets on Tuesdays and Fridays Recheck in 4 weeks   

## 2018-01-13 DIAGNOSIS — E870 Hyperosmolality and hypernatremia: Secondary | ICD-10-CM | POA: Diagnosis not present

## 2018-01-18 ENCOUNTER — Ambulatory Visit (INDEPENDENT_AMBULATORY_CARE_PROVIDER_SITE_OTHER): Payer: Medicare PPO | Admitting: *Deleted

## 2018-01-18 DIAGNOSIS — I4891 Unspecified atrial fibrillation: Secondary | ICD-10-CM

## 2018-01-18 DIAGNOSIS — Z5181 Encounter for therapeutic drug level monitoring: Secondary | ICD-10-CM | POA: Diagnosis not present

## 2018-01-18 LAB — POCT INR: INR: 2.7 (ref 2.0–3.0)

## 2018-01-18 NOTE — Patient Instructions (Signed)
Continue coumadin 1 tablet daily except 1 1/2 tablets on Tuesdays and Fridays Recheck in 4 weeks   

## 2018-01-19 DIAGNOSIS — E1122 Type 2 diabetes mellitus with diabetic chronic kidney disease: Secondary | ICD-10-CM | POA: Diagnosis not present

## 2018-01-19 DIAGNOSIS — N182 Chronic kidney disease, stage 2 (mild): Secondary | ICD-10-CM | POA: Diagnosis not present

## 2018-01-19 DIAGNOSIS — E1143 Type 2 diabetes mellitus with diabetic autonomic (poly)neuropathy: Secondary | ICD-10-CM | POA: Diagnosis not present

## 2018-01-19 DIAGNOSIS — Z6822 Body mass index (BMI) 22.0-22.9, adult: Secondary | ICD-10-CM | POA: Diagnosis not present

## 2018-01-19 DIAGNOSIS — R531 Weakness: Secondary | ICD-10-CM | POA: Diagnosis not present

## 2018-01-19 DIAGNOSIS — E782 Mixed hyperlipidemia: Secondary | ICD-10-CM | POA: Diagnosis not present

## 2018-01-19 DIAGNOSIS — E870 Hyperosmolality and hypernatremia: Secondary | ICD-10-CM | POA: Diagnosis not present

## 2018-01-19 DIAGNOSIS — R2681 Unsteadiness on feet: Secondary | ICD-10-CM | POA: Diagnosis not present

## 2018-01-26 DIAGNOSIS — L570 Actinic keratosis: Secondary | ICD-10-CM | POA: Diagnosis not present

## 2018-01-26 DIAGNOSIS — L720 Epidermal cyst: Secondary | ICD-10-CM | POA: Diagnosis not present

## 2018-01-26 DIAGNOSIS — L7211 Pilar cyst: Secondary | ICD-10-CM | POA: Diagnosis not present

## 2018-01-26 DIAGNOSIS — L821 Other seborrheic keratosis: Secondary | ICD-10-CM | POA: Diagnosis not present

## 2018-01-27 DIAGNOSIS — I517 Cardiomegaly: Secondary | ICD-10-CM | POA: Diagnosis not present

## 2018-01-27 DIAGNOSIS — I482 Chronic atrial fibrillation: Secondary | ICD-10-CM | POA: Diagnosis not present

## 2018-01-27 DIAGNOSIS — I340 Nonrheumatic mitral (valve) insufficiency: Secondary | ICD-10-CM | POA: Diagnosis not present

## 2018-01-27 DIAGNOSIS — R600 Localized edema: Secondary | ICD-10-CM | POA: Diagnosis not present

## 2018-01-27 DIAGNOSIS — R079 Chest pain, unspecified: Secondary | ICD-10-CM | POA: Diagnosis not present

## 2018-01-27 DIAGNOSIS — I351 Nonrheumatic aortic (valve) insufficiency: Secondary | ICD-10-CM | POA: Diagnosis not present

## 2018-02-17 ENCOUNTER — Ambulatory Visit (INDEPENDENT_AMBULATORY_CARE_PROVIDER_SITE_OTHER): Payer: Medicare PPO | Admitting: *Deleted

## 2018-02-17 DIAGNOSIS — I4891 Unspecified atrial fibrillation: Secondary | ICD-10-CM | POA: Diagnosis not present

## 2018-02-17 DIAGNOSIS — Z5181 Encounter for therapeutic drug level monitoring: Secondary | ICD-10-CM

## 2018-02-17 LAB — POCT INR: INR: 3 (ref 2.0–3.0)

## 2018-02-17 NOTE — Patient Instructions (Signed)
Continue coumadin 1 tablet daily except 1 1/2 tablets on Tuesdays and Fridays Recheck in 6 weeks 

## 2018-03-16 DIAGNOSIS — M79676 Pain in unspecified toe(s): Secondary | ICD-10-CM | POA: Diagnosis not present

## 2018-03-16 DIAGNOSIS — B351 Tinea unguium: Secondary | ICD-10-CM | POA: Diagnosis not present

## 2018-03-31 ENCOUNTER — Ambulatory Visit (INDEPENDENT_AMBULATORY_CARE_PROVIDER_SITE_OTHER): Payer: Medicare PPO | Admitting: *Deleted

## 2018-03-31 DIAGNOSIS — Z5181 Encounter for therapeutic drug level monitoring: Secondary | ICD-10-CM | POA: Diagnosis not present

## 2018-03-31 DIAGNOSIS — I4891 Unspecified atrial fibrillation: Secondary | ICD-10-CM | POA: Diagnosis not present

## 2018-03-31 LAB — POCT INR: INR: 2.6 (ref 2.0–3.0)

## 2018-03-31 NOTE — Patient Instructions (Signed)
Continue coumadin 1 tablet daily except 1 1/2 tablets on Tuesdays Recheck in 4 weeks 

## 2018-04-11 DIAGNOSIS — N182 Chronic kidney disease, stage 2 (mild): Secondary | ICD-10-CM | POA: Diagnosis not present

## 2018-04-11 DIAGNOSIS — E039 Hypothyroidism, unspecified: Secondary | ICD-10-CM | POA: Diagnosis not present

## 2018-04-11 DIAGNOSIS — E870 Hyperosmolality and hypernatremia: Secondary | ICD-10-CM | POA: Diagnosis not present

## 2018-04-11 DIAGNOSIS — E1143 Type 2 diabetes mellitus with diabetic autonomic (poly)neuropathy: Secondary | ICD-10-CM | POA: Diagnosis not present

## 2018-04-11 DIAGNOSIS — E782 Mixed hyperlipidemia: Secondary | ICD-10-CM | POA: Diagnosis not present

## 2018-04-13 DIAGNOSIS — E1122 Type 2 diabetes mellitus with diabetic chronic kidney disease: Secondary | ICD-10-CM | POA: Diagnosis not present

## 2018-04-13 DIAGNOSIS — E1143 Type 2 diabetes mellitus with diabetic autonomic (poly)neuropathy: Secondary | ICD-10-CM | POA: Diagnosis not present

## 2018-04-13 DIAGNOSIS — Z23 Encounter for immunization: Secondary | ICD-10-CM | POA: Diagnosis not present

## 2018-04-13 DIAGNOSIS — R531 Weakness: Secondary | ICD-10-CM | POA: Diagnosis not present

## 2018-04-13 DIAGNOSIS — Z6822 Body mass index (BMI) 22.0-22.9, adult: Secondary | ICD-10-CM | POA: Diagnosis not present

## 2018-04-13 DIAGNOSIS — I482 Chronic atrial fibrillation, unspecified: Secondary | ICD-10-CM | POA: Diagnosis not present

## 2018-04-13 DIAGNOSIS — E039 Hypothyroidism, unspecified: Secondary | ICD-10-CM | POA: Diagnosis not present

## 2018-04-13 DIAGNOSIS — N182 Chronic kidney disease, stage 2 (mild): Secondary | ICD-10-CM | POA: Diagnosis not present

## 2018-04-21 DIAGNOSIS — R2689 Other abnormalities of gait and mobility: Secondary | ICD-10-CM | POA: Diagnosis not present

## 2018-04-21 DIAGNOSIS — R262 Difficulty in walking, not elsewhere classified: Secondary | ICD-10-CM | POA: Diagnosis not present

## 2018-04-21 DIAGNOSIS — R531 Weakness: Secondary | ICD-10-CM | POA: Diagnosis not present

## 2018-04-25 DIAGNOSIS — R531 Weakness: Secondary | ICD-10-CM | POA: Diagnosis not present

## 2018-04-25 DIAGNOSIS — R2689 Other abnormalities of gait and mobility: Secondary | ICD-10-CM | POA: Diagnosis not present

## 2018-04-25 DIAGNOSIS — R262 Difficulty in walking, not elsewhere classified: Secondary | ICD-10-CM | POA: Diagnosis not present

## 2018-04-27 DIAGNOSIS — R2689 Other abnormalities of gait and mobility: Secondary | ICD-10-CM | POA: Diagnosis not present

## 2018-04-27 DIAGNOSIS — R262 Difficulty in walking, not elsewhere classified: Secondary | ICD-10-CM | POA: Diagnosis not present

## 2018-04-27 DIAGNOSIS — R531 Weakness: Secondary | ICD-10-CM | POA: Diagnosis not present

## 2018-04-28 ENCOUNTER — Ambulatory Visit (INDEPENDENT_AMBULATORY_CARE_PROVIDER_SITE_OTHER): Payer: Medicare PPO | Admitting: *Deleted

## 2018-04-28 DIAGNOSIS — I4891 Unspecified atrial fibrillation: Secondary | ICD-10-CM | POA: Diagnosis not present

## 2018-04-28 DIAGNOSIS — Z5181 Encounter for therapeutic drug level monitoring: Secondary | ICD-10-CM | POA: Diagnosis not present

## 2018-04-28 LAB — POCT INR: INR: 2.3 (ref 2.0–3.0)

## 2018-04-28 NOTE — Patient Instructions (Signed)
Continue coumadin 1 tablet daily except 1 1/2 tablets on Tuesdays Recheck in 6 weeks 

## 2018-05-05 DIAGNOSIS — R531 Weakness: Secondary | ICD-10-CM | POA: Diagnosis not present

## 2018-05-05 DIAGNOSIS — R262 Difficulty in walking, not elsewhere classified: Secondary | ICD-10-CM | POA: Diagnosis not present

## 2018-05-05 DIAGNOSIS — R2689 Other abnormalities of gait and mobility: Secondary | ICD-10-CM | POA: Diagnosis not present

## 2018-05-09 DIAGNOSIS — R2689 Other abnormalities of gait and mobility: Secondary | ICD-10-CM | POA: Diagnosis not present

## 2018-05-09 DIAGNOSIS — R262 Difficulty in walking, not elsewhere classified: Secondary | ICD-10-CM | POA: Diagnosis not present

## 2018-05-09 DIAGNOSIS — R531 Weakness: Secondary | ICD-10-CM | POA: Diagnosis not present

## 2018-05-12 DIAGNOSIS — R531 Weakness: Secondary | ICD-10-CM | POA: Diagnosis not present

## 2018-05-12 DIAGNOSIS — R262 Difficulty in walking, not elsewhere classified: Secondary | ICD-10-CM | POA: Diagnosis not present

## 2018-05-12 DIAGNOSIS — R2689 Other abnormalities of gait and mobility: Secondary | ICD-10-CM | POA: Diagnosis not present

## 2018-05-25 DIAGNOSIS — L84 Corns and callosities: Secondary | ICD-10-CM | POA: Diagnosis not present

## 2018-05-25 DIAGNOSIS — M79676 Pain in unspecified toe(s): Secondary | ICD-10-CM | POA: Diagnosis not present

## 2018-05-25 DIAGNOSIS — B351 Tinea unguium: Secondary | ICD-10-CM | POA: Diagnosis not present

## 2018-05-25 DIAGNOSIS — E1142 Type 2 diabetes mellitus with diabetic polyneuropathy: Secondary | ICD-10-CM | POA: Diagnosis not present

## 2018-06-09 ENCOUNTER — Ambulatory Visit (INDEPENDENT_AMBULATORY_CARE_PROVIDER_SITE_OTHER): Payer: Medicare PPO | Admitting: Pharmacist

## 2018-06-09 DIAGNOSIS — I4891 Unspecified atrial fibrillation: Secondary | ICD-10-CM | POA: Diagnosis not present

## 2018-06-09 DIAGNOSIS — Z5181 Encounter for therapeutic drug level monitoring: Secondary | ICD-10-CM

## 2018-06-09 LAB — POCT INR: INR: 1.9 — AB (ref 2.0–3.0)

## 2018-06-09 NOTE — Patient Instructions (Signed)
Description   Take 1 1/2 tablets today then continue coumadin 1 tablet daily except 1 1/2 tablets on Tuesdays. Recheck in 4 weeks

## 2018-07-07 ENCOUNTER — Ambulatory Visit (INDEPENDENT_AMBULATORY_CARE_PROVIDER_SITE_OTHER): Payer: Medicare PPO | Admitting: *Deleted

## 2018-07-07 DIAGNOSIS — Z5181 Encounter for therapeutic drug level monitoring: Secondary | ICD-10-CM

## 2018-07-07 DIAGNOSIS — I4891 Unspecified atrial fibrillation: Secondary | ICD-10-CM | POA: Diagnosis not present

## 2018-07-07 LAB — POCT INR: INR: 3 (ref 2.0–3.0)

## 2018-07-07 NOTE — Patient Instructions (Signed)
Continue coumadin 1 tablet daily except 1 1/2 tablets on Tuesdays Recheck in 4 weeks 

## 2018-07-13 DIAGNOSIS — R531 Weakness: Secondary | ICD-10-CM | POA: Diagnosis not present

## 2018-07-13 DIAGNOSIS — E870 Hyperosmolality and hypernatremia: Secondary | ICD-10-CM | POA: Diagnosis not present

## 2018-07-13 DIAGNOSIS — Z6822 Body mass index (BMI) 22.0-22.9, adult: Secondary | ICD-10-CM | POA: Diagnosis not present

## 2018-07-13 DIAGNOSIS — I482 Chronic atrial fibrillation, unspecified: Secondary | ICD-10-CM | POA: Diagnosis not present

## 2018-07-13 DIAGNOSIS — F0151 Vascular dementia with behavioral disturbance: Secondary | ICD-10-CM | POA: Diagnosis not present

## 2018-07-13 DIAGNOSIS — E1143 Type 2 diabetes mellitus with diabetic autonomic (poly)neuropathy: Secondary | ICD-10-CM | POA: Diagnosis not present

## 2018-07-13 DIAGNOSIS — E1122 Type 2 diabetes mellitus with diabetic chronic kidney disease: Secondary | ICD-10-CM | POA: Diagnosis not present

## 2018-07-13 DIAGNOSIS — G619 Inflammatory polyneuropathy, unspecified: Secondary | ICD-10-CM | POA: Diagnosis not present

## 2018-08-02 ENCOUNTER — Telehealth: Payer: Self-pay | Admitting: Pharmacist

## 2018-08-02 NOTE — Telephone Encounter (Signed)
LMOM x2 to prescreen COVID and advise pt of drive up INR service in [place] office.  1. Do you currently have a fever?     (yes = cancel and refer to pcp for e-visit) 2. Have you recently travelled on a cruise, internationally, or to [place], [place], [place], [place], [place], or [place], [place] [employer]) ?     (yes = cancel, stay home, monitor symptoms, and contact pcp or initiate e-visit if symptoms develop) 3. Have you been in contact with someone that is currently pending confirmation of Covid19 testing or has been confirmed to have the Covid19 virus?      (yes = cancel, stay home, away from tested individual, monitor symptoms, and contact pcp or initiate e-visit if symptoms develop) 4. Are you currently experiencing fatigue or cough?     (yes = pt should be prepared to have a mask placed at the time of their visit).  Pt. Advised that we are restricting visitors at this time and anyone present in the vehicle should meet the above criteria as well. Advised that visit will be at curbside for finger stick ONLY and will receive call with instructions. Pt also advised to please bring own pen for signature of arrival document.

## 2018-08-04 ENCOUNTER — Ambulatory Visit (INDEPENDENT_AMBULATORY_CARE_PROVIDER_SITE_OTHER): Payer: Medicare PPO | Admitting: *Deleted

## 2018-08-04 DIAGNOSIS — Z5181 Encounter for therapeutic drug level monitoring: Secondary | ICD-10-CM

## 2018-08-04 DIAGNOSIS — I4891 Unspecified atrial fibrillation: Secondary | ICD-10-CM

## 2018-08-04 LAB — POCT INR: INR: 2.2 (ref 2.0–3.0)

## 2018-08-04 MED ORDER — WARFARIN SODIUM 5 MG PO TABS: ORAL_TABLET | ORAL | 3 refills | Status: DC

## 2018-08-04 MED ORDER — METOPROLOL TARTRATE 25 MG PO TABS: 25.0000 mg | ORAL_TABLET | Freq: Two times a day (BID) | ORAL | 3 refills | Status: AC

## 2018-08-04 NOTE — Patient Instructions (Signed)
Continue coumadin 1 tablet daily except 1 1/2 tablets on Tuesdays Recheck in 6 weeks 

## 2018-09-06 ENCOUNTER — Ambulatory Visit: Payer: Medicare PPO | Admitting: Internal Medicine

## 2018-09-15 ENCOUNTER — Ambulatory Visit (INDEPENDENT_AMBULATORY_CARE_PROVIDER_SITE_OTHER): Payer: Medicare PPO | Admitting: *Deleted

## 2018-09-15 ENCOUNTER — Other Ambulatory Visit: Payer: Self-pay

## 2018-09-15 DIAGNOSIS — I4891 Unspecified atrial fibrillation: Secondary | ICD-10-CM

## 2018-09-15 DIAGNOSIS — Z5181 Encounter for therapeutic drug level monitoring: Secondary | ICD-10-CM

## 2018-09-15 DIAGNOSIS — G459 Transient cerebral ischemic attack, unspecified: Secondary | ICD-10-CM | POA: Diagnosis not present

## 2018-09-15 LAB — POCT INR: INR: 2.5 (ref 2.0–3.0)

## 2018-09-15 NOTE — Patient Instructions (Signed)
Continue coumadin 1 tablet daily except 1 1/2 tablets on Tuesdays Recheck in 6 weeks 

## 2018-10-06 DIAGNOSIS — E870 Hyperosmolality and hypernatremia: Secondary | ICD-10-CM | POA: Diagnosis not present

## 2018-10-06 DIAGNOSIS — I482 Chronic atrial fibrillation, unspecified: Secondary | ICD-10-CM | POA: Diagnosis not present

## 2018-10-06 DIAGNOSIS — E1122 Type 2 diabetes mellitus with diabetic chronic kidney disease: Secondary | ICD-10-CM | POA: Diagnosis not present

## 2018-10-06 DIAGNOSIS — N182 Chronic kidney disease, stage 2 (mild): Secondary | ICD-10-CM | POA: Diagnosis not present

## 2018-10-06 DIAGNOSIS — I82409 Acute embolism and thrombosis of unspecified deep veins of unspecified lower extremity: Secondary | ICD-10-CM | POA: Diagnosis not present

## 2018-10-06 DIAGNOSIS — E039 Hypothyroidism, unspecified: Secondary | ICD-10-CM | POA: Diagnosis not present

## 2018-10-06 DIAGNOSIS — E782 Mixed hyperlipidemia: Secondary | ICD-10-CM | POA: Diagnosis not present

## 2018-10-11 DIAGNOSIS — G619 Inflammatory polyneuropathy, unspecified: Secondary | ICD-10-CM | POA: Diagnosis not present

## 2018-10-11 DIAGNOSIS — E1122 Type 2 diabetes mellitus with diabetic chronic kidney disease: Secondary | ICD-10-CM | POA: Diagnosis not present

## 2018-10-11 DIAGNOSIS — F0151 Vascular dementia with behavioral disturbance: Secondary | ICD-10-CM | POA: Diagnosis not present

## 2018-10-11 DIAGNOSIS — Z6822 Body mass index (BMI) 22.0-22.9, adult: Secondary | ICD-10-CM | POA: Diagnosis not present

## 2018-10-11 DIAGNOSIS — E441 Mild protein-calorie malnutrition: Secondary | ICD-10-CM | POA: Diagnosis not present

## 2018-10-11 DIAGNOSIS — I482 Chronic atrial fibrillation, unspecified: Secondary | ICD-10-CM | POA: Diagnosis not present

## 2018-10-11 DIAGNOSIS — E870 Hyperosmolality and hypernatremia: Secondary | ICD-10-CM | POA: Diagnosis not present

## 2018-10-11 DIAGNOSIS — E1143 Type 2 diabetes mellitus with diabetic autonomic (poly)neuropathy: Secondary | ICD-10-CM | POA: Diagnosis not present

## 2018-10-26 ENCOUNTER — Ambulatory Visit (INDEPENDENT_AMBULATORY_CARE_PROVIDER_SITE_OTHER): Payer: Medicare PPO | Admitting: *Deleted

## 2018-10-26 DIAGNOSIS — G459 Transient cerebral ischemic attack, unspecified: Secondary | ICD-10-CM

## 2018-10-26 DIAGNOSIS — I4891 Unspecified atrial fibrillation: Secondary | ICD-10-CM | POA: Diagnosis not present

## 2018-10-26 DIAGNOSIS — Z5181 Encounter for therapeutic drug level monitoring: Secondary | ICD-10-CM | POA: Diagnosis not present

## 2018-10-26 LAB — POCT INR: INR: 2.3 (ref 2.0–3.0)

## 2018-10-26 NOTE — Patient Instructions (Signed)
Continue coumadin 1 tablet daily except 1 1/2 tablets on Tuesdays Recheck in 6 weeks 

## 2018-11-18 DIAGNOSIS — L84 Corns and callosities: Secondary | ICD-10-CM | POA: Diagnosis not present

## 2018-11-18 DIAGNOSIS — B351 Tinea unguium: Secondary | ICD-10-CM | POA: Diagnosis not present

## 2018-11-18 DIAGNOSIS — M79676 Pain in unspecified toe(s): Secondary | ICD-10-CM | POA: Diagnosis not present

## 2018-11-18 DIAGNOSIS — I70203 Unspecified atherosclerosis of native arteries of extremities, bilateral legs: Secondary | ICD-10-CM | POA: Diagnosis not present

## 2018-11-21 DIAGNOSIS — N200 Calculus of kidney: Secondary | ICD-10-CM | POA: Diagnosis not present

## 2018-11-21 DIAGNOSIS — N281 Cyst of kidney, acquired: Secondary | ICD-10-CM | POA: Diagnosis not present

## 2018-11-21 DIAGNOSIS — N319 Neuromuscular dysfunction of bladder, unspecified: Secondary | ICD-10-CM | POA: Diagnosis not present

## 2018-12-07 ENCOUNTER — Other Ambulatory Visit: Payer: Self-pay

## 2018-12-07 ENCOUNTER — Ambulatory Visit (INDEPENDENT_AMBULATORY_CARE_PROVIDER_SITE_OTHER): Payer: Medicare PPO | Admitting: *Deleted

## 2018-12-07 DIAGNOSIS — Z5181 Encounter for therapeutic drug level monitoring: Secondary | ICD-10-CM

## 2018-12-07 DIAGNOSIS — G459 Transient cerebral ischemic attack, unspecified: Secondary | ICD-10-CM | POA: Diagnosis not present

## 2018-12-07 DIAGNOSIS — I4891 Unspecified atrial fibrillation: Secondary | ICD-10-CM | POA: Diagnosis not present

## 2018-12-07 LAB — POCT INR: INR: 2.3 (ref 2.0–3.0)

## 2018-12-07 NOTE — Patient Instructions (Signed)
Continue coumadin 1 tablet daily except 1 1/2 tablets on Tuesdays Recheck in 6 weeks 

## 2019-01-06 DIAGNOSIS — E782 Mixed hyperlipidemia: Secondary | ICD-10-CM | POA: Diagnosis not present

## 2019-01-06 DIAGNOSIS — I482 Chronic atrial fibrillation, unspecified: Secondary | ICD-10-CM | POA: Diagnosis not present

## 2019-01-06 DIAGNOSIS — N182 Chronic kidney disease, stage 2 (mild): Secondary | ICD-10-CM | POA: Diagnosis not present

## 2019-01-06 DIAGNOSIS — E870 Hyperosmolality and hypernatremia: Secondary | ICD-10-CM | POA: Diagnosis not present

## 2019-01-06 DIAGNOSIS — E1122 Type 2 diabetes mellitus with diabetic chronic kidney disease: Secondary | ICD-10-CM | POA: Diagnosis not present

## 2019-01-06 DIAGNOSIS — E039 Hypothyroidism, unspecified: Secondary | ICD-10-CM | POA: Diagnosis not present

## 2019-01-06 DIAGNOSIS — I82409 Acute embolism and thrombosis of unspecified deep veins of unspecified lower extremity: Secondary | ICD-10-CM | POA: Diagnosis not present

## 2019-01-10 ENCOUNTER — Ambulatory Visit: Payer: Self-pay | Admitting: *Deleted

## 2019-01-10 ENCOUNTER — Telehealth: Payer: Self-pay | Admitting: *Deleted

## 2019-01-10 DIAGNOSIS — F0151 Vascular dementia with behavioral disturbance: Secondary | ICD-10-CM | POA: Diagnosis not present

## 2019-01-10 DIAGNOSIS — E1143 Type 2 diabetes mellitus with diabetic autonomic (poly)neuropathy: Secondary | ICD-10-CM | POA: Diagnosis not present

## 2019-01-10 DIAGNOSIS — Z6821 Body mass index (BMI) 21.0-21.9, adult: Secondary | ICD-10-CM | POA: Diagnosis not present

## 2019-01-10 DIAGNOSIS — E1122 Type 2 diabetes mellitus with diabetic chronic kidney disease: Secondary | ICD-10-CM | POA: Diagnosis not present

## 2019-01-10 DIAGNOSIS — E441 Mild protein-calorie malnutrition: Secondary | ICD-10-CM | POA: Diagnosis not present

## 2019-01-10 DIAGNOSIS — R531 Weakness: Secondary | ICD-10-CM | POA: Diagnosis not present

## 2019-01-10 DIAGNOSIS — E870 Hyperosmolality and hypernatremia: Secondary | ICD-10-CM | POA: Diagnosis not present

## 2019-01-10 DIAGNOSIS — E782 Mixed hyperlipidemia: Secondary | ICD-10-CM | POA: Diagnosis not present

## 2019-01-10 NOTE — Telephone Encounter (Signed)
Dr Iverson Alamin office called to let us know that pt will no longer be taking coumadin due to increased risk of falls

## 2019-01-27 DIAGNOSIS — B351 Tinea unguium: Secondary | ICD-10-CM | POA: Diagnosis not present

## 2019-01-27 DIAGNOSIS — L84 Corns and callosities: Secondary | ICD-10-CM | POA: Diagnosis not present

## 2019-01-27 DIAGNOSIS — M79676 Pain in unspecified toe(s): Secondary | ICD-10-CM | POA: Diagnosis not present

## 2019-01-27 DIAGNOSIS — I70203 Unspecified atherosclerosis of native arteries of extremities, bilateral legs: Secondary | ICD-10-CM | POA: Diagnosis not present

## 2019-03-31 DIAGNOSIS — I70203 Unspecified atherosclerosis of native arteries of extremities, bilateral legs: Secondary | ICD-10-CM | POA: Diagnosis not present

## 2019-03-31 DIAGNOSIS — L84 Corns and callosities: Secondary | ICD-10-CM | POA: Diagnosis not present

## 2019-03-31 DIAGNOSIS — B351 Tinea unguium: Secondary | ICD-10-CM | POA: Diagnosis not present

## 2019-03-31 DIAGNOSIS — M79676 Pain in unspecified toe(s): Secondary | ICD-10-CM | POA: Diagnosis not present

## 2019-04-03 DIAGNOSIS — F0390 Unspecified dementia without behavioral disturbance: Secondary | ICD-10-CM | POA: Diagnosis not present

## 2019-04-03 DIAGNOSIS — N179 Acute kidney failure, unspecified: Secondary | ICD-10-CM | POA: Diagnosis not present

## 2019-04-03 DIAGNOSIS — E1165 Type 2 diabetes mellitus with hyperglycemia: Secondary | ICD-10-CM | POA: Diagnosis not present

## 2019-04-03 DIAGNOSIS — I482 Chronic atrial fibrillation, unspecified: Secondary | ICD-10-CM | POA: Diagnosis not present

## 2019-04-03 DIAGNOSIS — R05 Cough: Secondary | ICD-10-CM | POA: Diagnosis not present

## 2019-04-03 DIAGNOSIS — N390 Urinary tract infection, site not specified: Secondary | ICD-10-CM | POA: Diagnosis not present

## 2019-04-03 DIAGNOSIS — J189 Pneumonia, unspecified organism: Secondary | ICD-10-CM | POA: Diagnosis not present

## 2019-04-03 DIAGNOSIS — J9601 Acute respiratory failure with hypoxia: Secondary | ICD-10-CM | POA: Diagnosis not present

## 2019-04-03 DIAGNOSIS — Z20828 Contact with and (suspected) exposure to other viral communicable diseases: Secondary | ICD-10-CM | POA: Diagnosis not present

## 2019-04-03 DIAGNOSIS — I491 Atrial premature depolarization: Secondary | ICD-10-CM | POA: Diagnosis not present

## 2019-04-03 DIAGNOSIS — G9341 Metabolic encephalopathy: Secondary | ICD-10-CM | POA: Diagnosis not present

## 2019-04-03 DIAGNOSIS — J188 Other pneumonia, unspecified organism: Secondary | ICD-10-CM | POA: Diagnosis not present

## 2019-04-03 DIAGNOSIS — E039 Hypothyroidism, unspecified: Secondary | ICD-10-CM | POA: Diagnosis not present

## 2019-04-03 DIAGNOSIS — R0689 Other abnormalities of breathing: Secondary | ICD-10-CM | POA: Diagnosis not present

## 2019-04-03 DIAGNOSIS — T17890A Other foreign object in other parts of respiratory tract causing asphyxiation, initial encounter: Secondary | ICD-10-CM | POA: Diagnosis not present

## 2019-04-03 DIAGNOSIS — R0902 Hypoxemia: Secondary | ICD-10-CM | POA: Diagnosis not present

## 2019-04-03 DIAGNOSIS — I4891 Unspecified atrial fibrillation: Secondary | ICD-10-CM | POA: Diagnosis not present

## 2019-04-03 DIAGNOSIS — R042 Hemoptysis: Secondary | ICD-10-CM | POA: Diagnosis not present

## 2019-04-10 DIAGNOSIS — E039 Hypothyroidism, unspecified: Secondary | ICD-10-CM | POA: Diagnosis not present

## 2019-04-10 DIAGNOSIS — E782 Mixed hyperlipidemia: Secondary | ICD-10-CM | POA: Diagnosis not present

## 2019-05-05 DIAGNOSIS — E1143 Type 2 diabetes mellitus with diabetic autonomic (poly)neuropathy: Secondary | ICD-10-CM | POA: Diagnosis not present

## 2019-05-05 DIAGNOSIS — F0151 Vascular dementia with behavioral disturbance: Secondary | ICD-10-CM | POA: Diagnosis not present

## 2019-05-05 DIAGNOSIS — N132 Hydronephrosis with renal and ureteral calculous obstruction: Secondary | ICD-10-CM | POA: Diagnosis not present

## 2019-05-05 DIAGNOSIS — I482 Chronic atrial fibrillation, unspecified: Secondary | ICD-10-CM | POA: Diagnosis not present

## 2019-05-05 DIAGNOSIS — J189 Pneumonia, unspecified organism: Secondary | ICD-10-CM | POA: Diagnosis not present

## 2019-05-05 DIAGNOSIS — E1122 Type 2 diabetes mellitus with diabetic chronic kidney disease: Secondary | ICD-10-CM | POA: Diagnosis not present

## 2019-05-05 DIAGNOSIS — G619 Inflammatory polyneuropathy, unspecified: Secondary | ICD-10-CM | POA: Diagnosis not present

## 2019-05-05 DIAGNOSIS — E441 Mild protein-calorie malnutrition: Secondary | ICD-10-CM | POA: Diagnosis not present

## 2019-05-11 DIAGNOSIS — E039 Hypothyroidism, unspecified: Secondary | ICD-10-CM | POA: Diagnosis not present

## 2019-05-11 DIAGNOSIS — E782 Mixed hyperlipidemia: Secondary | ICD-10-CM | POA: Diagnosis not present

## 2019-05-17 DIAGNOSIS — F0151 Vascular dementia with behavioral disturbance: Secondary | ICD-10-CM | POA: Diagnosis not present

## 2019-05-17 DIAGNOSIS — E1143 Type 2 diabetes mellitus with diabetic autonomic (poly)neuropathy: Secondary | ICD-10-CM | POA: Diagnosis not present

## 2019-05-17 DIAGNOSIS — E1122 Type 2 diabetes mellitus with diabetic chronic kidney disease: Secondary | ICD-10-CM | POA: Diagnosis not present

## 2019-05-17 DIAGNOSIS — E441 Mild protein-calorie malnutrition: Secondary | ICD-10-CM | POA: Diagnosis not present

## 2019-05-17 DIAGNOSIS — E039 Hypothyroidism, unspecified: Secondary | ICD-10-CM | POA: Diagnosis not present

## 2019-05-17 DIAGNOSIS — Z6820 Body mass index (BMI) 20.0-20.9, adult: Secondary | ICD-10-CM | POA: Diagnosis not present

## 2019-05-17 DIAGNOSIS — R2681 Unsteadiness on feet: Secondary | ICD-10-CM | POA: Diagnosis not present

## 2019-05-17 DIAGNOSIS — I482 Chronic atrial fibrillation, unspecified: Secondary | ICD-10-CM | POA: Diagnosis not present

## 2019-06-02 DIAGNOSIS — R2681 Unsteadiness on feet: Secondary | ICD-10-CM | POA: Diagnosis not present

## 2019-06-02 DIAGNOSIS — G619 Inflammatory polyneuropathy, unspecified: Secondary | ICD-10-CM | POA: Diagnosis not present

## 2019-06-02 DIAGNOSIS — F0151 Vascular dementia with behavioral disturbance: Secondary | ICD-10-CM | POA: Diagnosis not present

## 2019-06-09 DIAGNOSIS — I482 Chronic atrial fibrillation, unspecified: Secondary | ICD-10-CM | POA: Diagnosis not present

## 2019-06-09 DIAGNOSIS — F0151 Vascular dementia with behavioral disturbance: Secondary | ICD-10-CM | POA: Diagnosis not present

## 2019-07-03 DIAGNOSIS — G619 Inflammatory polyneuropathy, unspecified: Secondary | ICD-10-CM | POA: Diagnosis not present

## 2019-07-03 DIAGNOSIS — R2681 Unsteadiness on feet: Secondary | ICD-10-CM | POA: Diagnosis not present

## 2019-07-03 DIAGNOSIS — F0151 Vascular dementia with behavioral disturbance: Secondary | ICD-10-CM | POA: Diagnosis not present

## 2019-07-07 DIAGNOSIS — I10 Essential (primary) hypertension: Secondary | ICD-10-CM | POA: Diagnosis not present

## 2019-07-07 DIAGNOSIS — E7849 Other hyperlipidemia: Secondary | ICD-10-CM | POA: Diagnosis not present

## 2019-07-31 DIAGNOSIS — R2681 Unsteadiness on feet: Secondary | ICD-10-CM | POA: Diagnosis not present

## 2019-07-31 DIAGNOSIS — G619 Inflammatory polyneuropathy, unspecified: Secondary | ICD-10-CM | POA: Diagnosis not present

## 2019-07-31 DIAGNOSIS — F0151 Vascular dementia with behavioral disturbance: Secondary | ICD-10-CM | POA: Diagnosis not present

## 2019-08-09 DIAGNOSIS — I10 Essential (primary) hypertension: Secondary | ICD-10-CM | POA: Diagnosis not present

## 2019-08-09 DIAGNOSIS — E7849 Other hyperlipidemia: Secondary | ICD-10-CM | POA: Diagnosis not present

## 2019-08-17 DIAGNOSIS — E782 Mixed hyperlipidemia: Secondary | ICD-10-CM | POA: Diagnosis not present

## 2019-08-17 DIAGNOSIS — N182 Chronic kidney disease, stage 2 (mild): Secondary | ICD-10-CM | POA: Diagnosis not present

## 2019-08-17 DIAGNOSIS — E039 Hypothyroidism, unspecified: Secondary | ICD-10-CM | POA: Diagnosis not present

## 2019-08-17 DIAGNOSIS — E1122 Type 2 diabetes mellitus with diabetic chronic kidney disease: Secondary | ICD-10-CM | POA: Diagnosis not present

## 2019-08-17 DIAGNOSIS — I482 Chronic atrial fibrillation, unspecified: Secondary | ICD-10-CM | POA: Diagnosis not present

## 2019-08-17 DIAGNOSIS — E1143 Type 2 diabetes mellitus with diabetic autonomic (poly)neuropathy: Secondary | ICD-10-CM | POA: Diagnosis not present

## 2019-08-17 DIAGNOSIS — I2699 Other pulmonary embolism without acute cor pulmonale: Secondary | ICD-10-CM | POA: Diagnosis not present

## 2019-08-17 DIAGNOSIS — E870 Hyperosmolality and hypernatremia: Secondary | ICD-10-CM | POA: Diagnosis not present

## 2019-08-21 DIAGNOSIS — E1143 Type 2 diabetes mellitus with diabetic autonomic (poly)neuropathy: Secondary | ICD-10-CM | POA: Diagnosis not present

## 2019-08-21 DIAGNOSIS — R2681 Unsteadiness on feet: Secondary | ICD-10-CM | POA: Diagnosis not present

## 2019-08-21 DIAGNOSIS — F0151 Vascular dementia with behavioral disturbance: Secondary | ICD-10-CM | POA: Diagnosis not present

## 2019-08-21 DIAGNOSIS — I482 Chronic atrial fibrillation, unspecified: Secondary | ICD-10-CM | POA: Diagnosis not present

## 2019-08-21 DIAGNOSIS — Z0001 Encounter for general adult medical examination with abnormal findings: Secondary | ICD-10-CM | POA: Diagnosis not present

## 2019-08-21 DIAGNOSIS — E039 Hypothyroidism, unspecified: Secondary | ICD-10-CM | POA: Diagnosis not present

## 2019-08-21 DIAGNOSIS — E440 Moderate protein-calorie malnutrition: Secondary | ICD-10-CM | POA: Diagnosis not present

## 2019-08-21 DIAGNOSIS — E1122 Type 2 diabetes mellitus with diabetic chronic kidney disease: Secondary | ICD-10-CM | POA: Diagnosis not present

## 2019-08-31 DIAGNOSIS — G619 Inflammatory polyneuropathy, unspecified: Secondary | ICD-10-CM | POA: Diagnosis not present

## 2019-08-31 DIAGNOSIS — R2681 Unsteadiness on feet: Secondary | ICD-10-CM | POA: Diagnosis not present

## 2019-08-31 DIAGNOSIS — F0151 Vascular dementia with behavioral disturbance: Secondary | ICD-10-CM | POA: Diagnosis not present

## 2019-09-08 DIAGNOSIS — F0151 Vascular dementia with behavioral disturbance: Secondary | ICD-10-CM | POA: Diagnosis not present

## 2019-09-08 DIAGNOSIS — I482 Chronic atrial fibrillation, unspecified: Secondary | ICD-10-CM | POA: Diagnosis not present

## 2019-09-08 DIAGNOSIS — G619 Inflammatory polyneuropathy, unspecified: Secondary | ICD-10-CM | POA: Diagnosis not present

## 2019-09-12 DIAGNOSIS — R05 Cough: Secondary | ICD-10-CM | POA: Diagnosis not present

## 2019-09-12 DIAGNOSIS — K219 Gastro-esophageal reflux disease without esophagitis: Secondary | ICD-10-CM | POA: Diagnosis not present

## 2019-09-12 DIAGNOSIS — N189 Chronic kidney disease, unspecified: Secondary | ICD-10-CM | POA: Diagnosis not present

## 2019-09-12 DIAGNOSIS — G4751 Confusional arousals: Secondary | ICD-10-CM | POA: Diagnosis not present

## 2019-09-12 DIAGNOSIS — I1310 Hypertensive heart and chronic kidney disease without heart failure, with stage 1 through stage 4 chronic kidney disease, or unspecified chronic kidney disease: Secondary | ICD-10-CM | POA: Diagnosis not present

## 2019-09-12 DIAGNOSIS — W06XXXA Fall from bed, initial encounter: Secondary | ICD-10-CM | POA: Diagnosis not present

## 2019-09-12 DIAGNOSIS — G9340 Encephalopathy, unspecified: Secondary | ICD-10-CM | POA: Diagnosis not present

## 2019-09-12 DIAGNOSIS — I219 Acute myocardial infarction, unspecified: Secondary | ICD-10-CM | POA: Diagnosis not present

## 2019-09-12 DIAGNOSIS — Z20822 Contact with and (suspected) exposure to covid-19: Secondary | ICD-10-CM | POA: Diagnosis not present

## 2019-09-12 DIAGNOSIS — E1122 Type 2 diabetes mellitus with diabetic chronic kidney disease: Secondary | ICD-10-CM | POA: Diagnosis not present

## 2019-09-12 DIAGNOSIS — E785 Hyperlipidemia, unspecified: Secondary | ICD-10-CM | POA: Diagnosis not present

## 2019-09-12 DIAGNOSIS — R410 Disorientation, unspecified: Secondary | ICD-10-CM | POA: Diagnosis not present

## 2019-09-12 DIAGNOSIS — Y92003 Bedroom of unspecified non-institutional (private) residence as the place of occurrence of the external cause: Secondary | ICD-10-CM | POA: Diagnosis not present

## 2019-09-12 DIAGNOSIS — R4182 Altered mental status, unspecified: Secondary | ICD-10-CM | POA: Diagnosis not present

## 2019-09-12 DIAGNOSIS — M25551 Pain in right hip: Secondary | ICD-10-CM | POA: Diagnosis not present

## 2019-09-12 DIAGNOSIS — R404 Transient alteration of awareness: Secondary | ICD-10-CM | POA: Diagnosis not present

## 2019-09-12 DIAGNOSIS — I10 Essential (primary) hypertension: Secondary | ICD-10-CM | POA: Diagnosis not present

## 2019-09-12 DIAGNOSIS — R000 Tachycardia, unspecified: Secondary | ICD-10-CM | POA: Diagnosis not present

## 2019-09-25 DIAGNOSIS — B351 Tinea unguium: Secondary | ICD-10-CM | POA: Diagnosis not present

## 2019-09-25 DIAGNOSIS — M79676 Pain in unspecified toe(s): Secondary | ICD-10-CM | POA: Diagnosis not present

## 2019-09-25 DIAGNOSIS — I70203 Unspecified atherosclerosis of native arteries of extremities, bilateral legs: Secondary | ICD-10-CM | POA: Diagnosis not present

## 2019-09-25 DIAGNOSIS — L84 Corns and callosities: Secondary | ICD-10-CM | POA: Diagnosis not present

## 2019-09-30 DIAGNOSIS — F0151 Vascular dementia with behavioral disturbance: Secondary | ICD-10-CM | POA: Diagnosis not present

## 2019-09-30 DIAGNOSIS — R2681 Unsteadiness on feet: Secondary | ICD-10-CM | POA: Diagnosis not present

## 2019-09-30 DIAGNOSIS — G619 Inflammatory polyneuropathy, unspecified: Secondary | ICD-10-CM | POA: Diagnosis not present

## 2019-10-09 DIAGNOSIS — E039 Hypothyroidism, unspecified: Secondary | ICD-10-CM | POA: Diagnosis not present

## 2019-10-09 DIAGNOSIS — I129 Hypertensive chronic kidney disease with stage 1 through stage 4 chronic kidney disease, or unspecified chronic kidney disease: Secondary | ICD-10-CM | POA: Diagnosis not present

## 2019-10-09 DIAGNOSIS — E7849 Other hyperlipidemia: Secondary | ICD-10-CM | POA: Diagnosis not present

## 2019-10-09 DIAGNOSIS — N182 Chronic kidney disease, stage 2 (mild): Secondary | ICD-10-CM | POA: Diagnosis not present

## 2019-10-09 DIAGNOSIS — E1122 Type 2 diabetes mellitus with diabetic chronic kidney disease: Secondary | ICD-10-CM | POA: Diagnosis not present

## 2019-10-25 DIAGNOSIS — R269 Unspecified abnormalities of gait and mobility: Secondary | ICD-10-CM | POA: Diagnosis not present

## 2019-10-25 DIAGNOSIS — R531 Weakness: Secondary | ICD-10-CM | POA: Diagnosis not present

## 2019-10-25 DIAGNOSIS — R296 Repeated falls: Secondary | ICD-10-CM | POA: Diagnosis not present

## 2019-10-26 ENCOUNTER — Encounter: Payer: Self-pay | Admitting: Internal Medicine

## 2019-10-26 ENCOUNTER — Ambulatory Visit: Payer: Medicare PPO | Admitting: Internal Medicine

## 2019-10-26 ENCOUNTER — Other Ambulatory Visit: Payer: Self-pay

## 2019-10-26 VITALS — BP 124/76 | HR 62 | Ht 69.0 in | Wt 125.0 lb

## 2019-10-26 DIAGNOSIS — I10 Essential (primary) hypertension: Secondary | ICD-10-CM | POA: Diagnosis not present

## 2019-10-26 NOTE — Patient Instructions (Signed)
Medication Instructions:  Your physician recommends that you continue on your current medications as directed. Please refer to the Current Medication list given to you today.  *If you need a refill on your cardiac medications before your next appointment, please call your pharmacy*   Lab Work: NONE   If you have labs (blood work) drawn today and your tests are completely normal, you will receive your results only by: MyChart Message (if you have MyChart) OR A paper copy in the mail If you have any lab test that is abnormal or we need to change your treatment, we will call you to review the results.   Testing/Procedures: NONE    Follow-Up: At [facility], you and your health needs are our priority.  As part of our continuing mission to provide you with exceptional heart care, we have created designated Provider Care Teams.  These Care Teams include your primary Cardiologist (physician) and Advanced Practice Providers (APPs -  Physician Assistants and Nurse Practitioners) who all work together to provide you with the care you need, when you need it.  We recommend signing up for the patient portal called "MyChart".  Sign up information is provided on this After Visit Summary.  MyChart is used to connect with patients for Virtual Visits (Telemedicine).  Patients are able to view lab/test results, encounter notes, upcoming appointments, etc.  Non-urgent messages can be sent to your provider as well.   To learn more about what you can do with MyChart, go to [email or web address].    Your next appointment:   6 month(s)  The format for your next appointment:   In Person  Provider:   Gregg Taylor, MD   Other Instructions Thank you for choosing  [facility]!    

## 2019-10-26 NOTE — Progress Notes (Signed)
HPI [name] returns today for ongoing evaluation and management of his atrial fib. He has been in the hospital with bronchitis and atrial fib. He has fallen twice. He has not had syncope. He denies chest pain but his energy level is poor. He has become increasingly less mobile.  Allergies  Allergen Reactions  . Amiodarone Hcl     Lost strength in legs.  . Gatifloxacin Other (See Comments)    unknown     Current Outpatient Medications  Medication Sig Dispense Refill  . ACCU-CHEK SMARTVIEW test strip Use as directed    . donepezil (ARICEPT) 10 MG tablet Take 1 tablet by mouth daily.    . finasteride (PROSCAR) 5 MG tablet Take 1 tablet by mouth daily.    Marland Kitchen levothyroxine (SYNTHROID, LEVOTHROID) 75 MCG tablet Take 75 mcg by mouth. TAKE 1/2 TABLET DAILY     . lisinopril (PRINIVIL,ZESTRIL) 5 MG tablet Take 5 mg by mouth daily.    . metoprolol tartrate (LOPRESSOR) 25 MG tablet Take 1 tablet (25 mg total) by mouth 2 (two) times daily. 180 tablet 3  . tamsulosin (FLOMAX) 0.4 MG CAPS capsule Take 0.4 mg by mouth daily.     No current facility-administered medications for this visit.     Past Medical History:  Diagnosis Date  . Atrial fibrillation ([place])   . BPH (benign prostatic hypertrophy)   . Carotid artery occlusion    carotid artery stenosis  . Dysphagia   . GERD (gastroesophageal reflux disease)   . History of kidney stones   . Hypertension   . Hypothyroidism   . Myocardial infarction ([place])   . PONV (postoperative nausea and vomiting)   . Transient ischemic attack     ROS:   All systems reviewed and negative except as noted in the HPI.   Past Surgical History:  Procedure Laterality Date  . BACK SURGERY  [address] in [place], [place]    . COLONOSCOPY    . CORONARY ARTERY BYPASS GRAFT     coronary artery bypass grafting time 6 with left internal mammery to ythe left anterior descending coronary artery ,reverse saphenous vein graft to  the intermediate coronary artery sequential reverse saphenous vein graft to the first obtuse marginal and distal circumflex,sequential reverse saphenous vein graft to the acute  marginal and posterior descending coronary artery branch of the right corh  . ESOPHAGOGASTRODUODENOSCOPY (EGD) WITH ESOPHAGEAL DILATION N/A 08/29/2012   Procedure: ESOPHAGOGASTRODUODENOSCOPY (EGD) WITH ESOPHAGEAL DILATION;  Surgeon: Rogene Houston, MD;  Location: [facility];  Service: Endoscopy;  Laterality: N/A;  1200  . HERNIA REPAIR Right 2006   [facility]  . UPPER GASTROINTESTINAL ENDOSCOPY       Family History  Problem Relation Age of Onset  . Heart Problems Mother      Social History   Socioeconomic History  . Marital status: Married    Spouse name: Danny Lucas  . Number of children: 2  . Years of education: 7 th  . Highest education level: Not on file  Occupational History    Employer: RETIRED    Comment: retired  Tobacco Use  . Smoking status: Former Smoker    Packs/day: 3.00    Years: 30.00    Pack years: 90.00    Types: Cigarettes    Quit date: 05/20/1960    Years since quitting: 59.4  . Smokeless tobacco: Never Used  . Tobacco comment: Patient quit smoking in 1962  Substance and Sexual Activity  . Alcohol use: No    Alcohol/week: 0.0 standard drinks  . Drug use: No  . Sexual activity: Not on file  Other Topics Concern  . Not on file  Social History Narrative   Patient  Lives at home with his wife Danny Lucas)   Retired   Education 7 th grade   Right handed   Caffeine none   Social Determinants of Corporate investment banker Strain:   . Difficulty of Paying Living Expenses:   Food Insecurity:   . Worried About Programme researcher, broadcasting/film/video in the Last Year:   . Barista in the Last Year:   Transportation Needs:   . Freight forwarder (Medical):   Marland Kitchen Lack of Transportation (Non-Medical):   Physical Activity:   . Days of Exercise per Week:   . Minutes of Exercise per Session:    Stress:   . Feeling of Stress :   Social Connections:   . Frequency of Communication with Friends and Family:   . Frequency of Social Gatherings with Friends and Family:   . Attends Religious Services:   . Active Member of Clubs or Organizations:   . Attends Banker Meetings:   Marland Kitchen Marital Status:   Intimate Partner Violence:   . Fear of Current or Ex-Partner:   . Emotionally Abused:   Marland Kitchen Physically Abused:   . Sexually Abused:      BP 124/76   Pulse 62   Ht 5\' 9"  (1.753 m)   Wt 125 lb (56.7 kg)   SpO2 96%   BMI 18.46 kg/m   Physical Exam:  Well appearing NAD HEENT: Unremarkable Neck:  No JVD, no thyromegally Lymphatics:  No adenopathy Back:  No CVA tenderness Lungs:  Clear HEART:  Regular rate rhythm, no murmurs, no rubs, no clicks Abd:  soft, positive bowel sounds, no organomegally, no rebound, no guarding Ext:  2 plus pulses, no edema, no cyanosis, no clubbing Skin:  No rashes no nodules Neuro:  CN II through XII intact, motor grossly intact  EKG - nsr  Assess/Plan: 1. PAF - he is a poor candidate for systemic anti-coagulation with his propensity to fall as well as his dementia. 2. Dementia - this appears to be his biggest problem. He is confused. 3. HTN - his bp is controlled. We will follow.  .D.

## 2019-10-31 DIAGNOSIS — F0151 Vascular dementia with behavioral disturbance: Secondary | ICD-10-CM | POA: Diagnosis not present

## 2019-10-31 DIAGNOSIS — G619 Inflammatory polyneuropathy, unspecified: Secondary | ICD-10-CM | POA: Diagnosis not present

## 2019-10-31 DIAGNOSIS — R2681 Unsteadiness on feet: Secondary | ICD-10-CM | POA: Diagnosis not present

## 2019-11-01 DIAGNOSIS — R296 Repeated falls: Secondary | ICD-10-CM | POA: Diagnosis not present

## 2019-11-01 DIAGNOSIS — R531 Weakness: Secondary | ICD-10-CM | POA: Diagnosis not present

## 2019-11-01 DIAGNOSIS — R269 Unspecified abnormalities of gait and mobility: Secondary | ICD-10-CM | POA: Diagnosis not present

## 2019-11-03 DIAGNOSIS — R531 Weakness: Secondary | ICD-10-CM | POA: Diagnosis not present

## 2019-11-03 DIAGNOSIS — R269 Unspecified abnormalities of gait and mobility: Secondary | ICD-10-CM | POA: Diagnosis not present

## 2019-11-03 DIAGNOSIS — R296 Repeated falls: Secondary | ICD-10-CM | POA: Diagnosis not present

## 2019-11-08 DIAGNOSIS — E7849 Other hyperlipidemia: Secondary | ICD-10-CM | POA: Diagnosis not present

## 2019-11-08 DIAGNOSIS — R296 Repeated falls: Secondary | ICD-10-CM | POA: Diagnosis not present

## 2019-11-08 DIAGNOSIS — E039 Hypothyroidism, unspecified: Secondary | ICD-10-CM | POA: Diagnosis not present

## 2019-11-08 DIAGNOSIS — R269 Unspecified abnormalities of gait and mobility: Secondary | ICD-10-CM | POA: Diagnosis not present

## 2019-11-08 DIAGNOSIS — N182 Chronic kidney disease, stage 2 (mild): Secondary | ICD-10-CM | POA: Diagnosis not present

## 2019-11-08 DIAGNOSIS — E1122 Type 2 diabetes mellitus with diabetic chronic kidney disease: Secondary | ICD-10-CM | POA: Diagnosis not present

## 2019-11-08 DIAGNOSIS — I129 Hypertensive chronic kidney disease with stage 1 through stage 4 chronic kidney disease, or unspecified chronic kidney disease: Secondary | ICD-10-CM | POA: Diagnosis not present

## 2019-11-08 DIAGNOSIS — R531 Weakness: Secondary | ICD-10-CM | POA: Diagnosis not present

## 2019-11-10 DIAGNOSIS — R269 Unspecified abnormalities of gait and mobility: Secondary | ICD-10-CM | POA: Diagnosis not present

## 2019-11-10 DIAGNOSIS — R531 Weakness: Secondary | ICD-10-CM | POA: Diagnosis not present

## 2019-11-10 DIAGNOSIS — R296 Repeated falls: Secondary | ICD-10-CM | POA: Diagnosis not present

## 2019-11-20 DIAGNOSIS — F0151 Vascular dementia with behavioral disturbance: Secondary | ICD-10-CM | POA: Diagnosis not present

## 2019-11-20 DIAGNOSIS — I482 Chronic atrial fibrillation, unspecified: Secondary | ICD-10-CM | POA: Diagnosis not present

## 2019-11-20 DIAGNOSIS — Z681 Body mass index (BMI) 19 or less, adult: Secondary | ICD-10-CM | POA: Diagnosis not present

## 2019-11-20 DIAGNOSIS — R2681 Unsteadiness on feet: Secondary | ICD-10-CM | POA: Diagnosis not present

## 2019-11-20 DIAGNOSIS — E1122 Type 2 diabetes mellitus with diabetic chronic kidney disease: Secondary | ICD-10-CM | POA: Diagnosis not present

## 2019-11-20 DIAGNOSIS — E039 Hypothyroidism, unspecified: Secondary | ICD-10-CM | POA: Diagnosis not present

## 2019-11-20 DIAGNOSIS — E440 Moderate protein-calorie malnutrition: Secondary | ICD-10-CM | POA: Diagnosis not present

## 2019-11-20 DIAGNOSIS — E1143 Type 2 diabetes mellitus with diabetic autonomic (poly)neuropathy: Secondary | ICD-10-CM | POA: Diagnosis not present

## 2019-11-22 DIAGNOSIS — R269 Unspecified abnormalities of gait and mobility: Secondary | ICD-10-CM | POA: Diagnosis not present

## 2019-11-22 DIAGNOSIS — R531 Weakness: Secondary | ICD-10-CM | POA: Diagnosis not present

## 2019-11-22 DIAGNOSIS — R296 Repeated falls: Secondary | ICD-10-CM | POA: Diagnosis not present

## 2019-11-24 DIAGNOSIS — R531 Weakness: Secondary | ICD-10-CM | POA: Diagnosis not present

## 2019-11-24 DIAGNOSIS — Z9181 History of falling: Secondary | ICD-10-CM | POA: Diagnosis not present

## 2019-11-29 DIAGNOSIS — R531 Weakness: Secondary | ICD-10-CM | POA: Diagnosis not present

## 2019-11-29 DIAGNOSIS — Z9181 History of falling: Secondary | ICD-10-CM | POA: Diagnosis not present

## 2019-11-30 DIAGNOSIS — R2681 Unsteadiness on feet: Secondary | ICD-10-CM | POA: Diagnosis not present

## 2019-11-30 DIAGNOSIS — G619 Inflammatory polyneuropathy, unspecified: Secondary | ICD-10-CM | POA: Diagnosis not present

## 2019-11-30 DIAGNOSIS — F0151 Vascular dementia with behavioral disturbance: Secondary | ICD-10-CM | POA: Diagnosis not present

## 2019-12-08 DIAGNOSIS — E7849 Other hyperlipidemia: Secondary | ICD-10-CM | POA: Diagnosis not present

## 2019-12-08 DIAGNOSIS — N182 Chronic kidney disease, stage 2 (mild): Secondary | ICD-10-CM | POA: Diagnosis not present

## 2019-12-08 DIAGNOSIS — E039 Hypothyroidism, unspecified: Secondary | ICD-10-CM | POA: Diagnosis not present

## 2019-12-08 DIAGNOSIS — E1122 Type 2 diabetes mellitus with diabetic chronic kidney disease: Secondary | ICD-10-CM | POA: Diagnosis not present

## 2019-12-08 DIAGNOSIS — I129 Hypertensive chronic kidney disease with stage 1 through stage 4 chronic kidney disease, or unspecified chronic kidney disease: Secondary | ICD-10-CM | POA: Diagnosis not present

## 2019-12-31 DIAGNOSIS — R2681 Unsteadiness on feet: Secondary | ICD-10-CM | POA: Diagnosis not present

## 2019-12-31 DIAGNOSIS — F0151 Vascular dementia with behavioral disturbance: Secondary | ICD-10-CM | POA: Diagnosis not present

## 2019-12-31 DIAGNOSIS — G619 Inflammatory polyneuropathy, unspecified: Secondary | ICD-10-CM | POA: Diagnosis not present

## 2020-01-09 DIAGNOSIS — N182 Chronic kidney disease, stage 2 (mild): Secondary | ICD-10-CM | POA: Diagnosis not present

## 2020-01-09 DIAGNOSIS — E1122 Type 2 diabetes mellitus with diabetic chronic kidney disease: Secondary | ICD-10-CM | POA: Diagnosis not present

## 2020-01-09 DIAGNOSIS — I129 Hypertensive chronic kidney disease with stage 1 through stage 4 chronic kidney disease, or unspecified chronic kidney disease: Secondary | ICD-10-CM | POA: Diagnosis not present

## 2020-01-09 DIAGNOSIS — E039 Hypothyroidism, unspecified: Secondary | ICD-10-CM | POA: Diagnosis not present

## 2020-01-31 DIAGNOSIS — F0151 Vascular dementia with behavioral disturbance: Secondary | ICD-10-CM | POA: Diagnosis not present

## 2020-01-31 DIAGNOSIS — R2681 Unsteadiness on feet: Secondary | ICD-10-CM | POA: Diagnosis not present

## 2020-01-31 DIAGNOSIS — G619 Inflammatory polyneuropathy, unspecified: Secondary | ICD-10-CM | POA: Diagnosis not present

## 2020-02-08 DIAGNOSIS — N182 Chronic kidney disease, stage 2 (mild): Secondary | ICD-10-CM | POA: Diagnosis not present

## 2020-02-08 DIAGNOSIS — E1122 Type 2 diabetes mellitus with diabetic chronic kidney disease: Secondary | ICD-10-CM | POA: Diagnosis not present

## 2020-02-08 DIAGNOSIS — I129 Hypertensive chronic kidney disease with stage 1 through stage 4 chronic kidney disease, or unspecified chronic kidney disease: Secondary | ICD-10-CM | POA: Diagnosis not present

## 2020-02-12 DIAGNOSIS — M79676 Pain in unspecified toe(s): Secondary | ICD-10-CM | POA: Diagnosis not present

## 2020-02-12 DIAGNOSIS — I70203 Unspecified atherosclerosis of native arteries of extremities, bilateral legs: Secondary | ICD-10-CM | POA: Diagnosis not present

## 2020-02-12 DIAGNOSIS — B351 Tinea unguium: Secondary | ICD-10-CM | POA: Diagnosis not present

## 2020-02-12 DIAGNOSIS — L84 Corns and callosities: Secondary | ICD-10-CM | POA: Diagnosis not present

## 2020-02-14 DIAGNOSIS — E440 Moderate protein-calorie malnutrition: Secondary | ICD-10-CM | POA: Diagnosis not present

## 2020-02-14 DIAGNOSIS — G619 Inflammatory polyneuropathy, unspecified: Secondary | ICD-10-CM | POA: Diagnosis not present

## 2020-02-14 DIAGNOSIS — R2681 Unsteadiness on feet: Secondary | ICD-10-CM | POA: Diagnosis not present

## 2020-02-14 DIAGNOSIS — I482 Chronic atrial fibrillation, unspecified: Secondary | ICD-10-CM | POA: Diagnosis not present

## 2020-02-14 DIAGNOSIS — E039 Hypothyroidism, unspecified: Secondary | ICD-10-CM | POA: Diagnosis not present

## 2020-02-14 DIAGNOSIS — E1143 Type 2 diabetes mellitus with diabetic autonomic (poly)neuropathy: Secondary | ICD-10-CM | POA: Diagnosis not present

## 2020-02-14 DIAGNOSIS — E1122 Type 2 diabetes mellitus with diabetic chronic kidney disease: Secondary | ICD-10-CM | POA: Diagnosis not present

## 2020-02-14 DIAGNOSIS — F0151 Vascular dementia with behavioral disturbance: Secondary | ICD-10-CM | POA: Diagnosis not present

## 2020-02-14 DIAGNOSIS — D692 Other nonthrombocytopenic purpura: Secondary | ICD-10-CM | POA: Diagnosis not present

## 2020-02-14 DIAGNOSIS — Z681 Body mass index (BMI) 19 or less, adult: Secondary | ICD-10-CM | POA: Diagnosis not present

## 2020-03-01 DIAGNOSIS — G619 Inflammatory polyneuropathy, unspecified: Secondary | ICD-10-CM | POA: Diagnosis not present

## 2020-03-01 DIAGNOSIS — F0151 Vascular dementia with behavioral disturbance: Secondary | ICD-10-CM | POA: Diagnosis not present

## 2020-03-01 DIAGNOSIS — R2681 Unsteadiness on feet: Secondary | ICD-10-CM | POA: Diagnosis not present

## 2020-04-01 DIAGNOSIS — F0151 Vascular dementia with behavioral disturbance: Secondary | ICD-10-CM | POA: Diagnosis not present

## 2020-04-01 DIAGNOSIS — G619 Inflammatory polyneuropathy, unspecified: Secondary | ICD-10-CM | POA: Diagnosis not present

## 2020-04-01 DIAGNOSIS — R2681 Unsteadiness on feet: Secondary | ICD-10-CM | POA: Diagnosis not present

## 2020-04-09 DIAGNOSIS — E1122 Type 2 diabetes mellitus with diabetic chronic kidney disease: Secondary | ICD-10-CM | POA: Diagnosis not present

## 2020-04-09 DIAGNOSIS — I129 Hypertensive chronic kidney disease with stage 1 through stage 4 chronic kidney disease, or unspecified chronic kidney disease: Secondary | ICD-10-CM | POA: Diagnosis not present

## 2020-04-09 DIAGNOSIS — N182 Chronic kidney disease, stage 2 (mild): Secondary | ICD-10-CM | POA: Diagnosis not present

## 2020-04-22 DIAGNOSIS — M79676 Pain in unspecified toe(s): Secondary | ICD-10-CM | POA: Diagnosis not present

## 2020-04-22 DIAGNOSIS — B351 Tinea unguium: Secondary | ICD-10-CM | POA: Diagnosis not present

## 2020-04-24 ENCOUNTER — Encounter: Payer: Self-pay | Admitting: Internal Medicine

## 2020-04-24 ENCOUNTER — Ambulatory Visit: Payer: Medicare PPO | Admitting: Internal Medicine

## 2020-04-24 ENCOUNTER — Other Ambulatory Visit: Payer: Self-pay

## 2020-04-24 VITALS — BP 134/82 | HR 51 | Ht 69.0 in | Wt 131.4 lb

## 2020-04-24 DIAGNOSIS — I4891 Unspecified atrial fibrillation: Secondary | ICD-10-CM | POA: Diagnosis not present

## 2020-04-24 DIAGNOSIS — I10 Essential (primary) hypertension: Secondary | ICD-10-CM

## 2020-04-24 NOTE — Progress Notes (Signed)
HPI [name] returns today for ongoing evaluation and management of his atrial fib. He has been in the hospital with bronchitis and atrial fib. He has fallen in the past. He has not had syncope. He denies chest pain but his energy level is poor. He has become increasingly less mobile.He has more dementia and has had trouble with auditory hallucinations. Allergies  Allergen Reactions  . Amiodarone Hcl     Lost strength in legs.  . Gatifloxacin Other (See Comments)    unknown     Current Outpatient Medications  Medication Sig Dispense Refill  . ACCU-CHEK SMARTVIEW test strip Use as directed    . finasteride (PROSCAR) 5 MG tablet Take 1 tablet by mouth daily.    Marland Kitchen levothyroxine (SYNTHROID, LEVOTHROID) 75 MCG tablet Take 75 mcg by mouth. TAKE 1/2 TABLET DAILY    . lisinopril (PRINIVIL,ZESTRIL) 5 MG tablet Take 5 mg by mouth daily.    . metoprolol tartrate (LOPRESSOR) 25 MG tablet Take 1 tablet (25 mg total) by mouth 2 (two) times daily. 180 tablet 3  . tamsulosin (FLOMAX) 0.4 MG CAPS capsule Take 0.4 mg by mouth daily.     No current facility-administered medications for this visit.     Past Medical History:  Diagnosis Date  . Atrial fibrillation (HCC)   . BPH (benign prostatic hypertrophy)   . Carotid artery occlusion    carotid artery stenosis  . Dysphagia   . GERD (gastroesophageal reflux disease)   . History of kidney stones   . Hypertension   . Hypothyroidism   . Myocardial infarction (HCC)   . PONV (postoperative nausea and vomiting)   . Transient ischemic attack     ROS:   All systems reviewed and negative except as noted in the HPI.   Past Surgical History:  Procedure Laterality Date  . BACK SURGERY  [address] in [place], [place]  . CHOLECYSTECTOMY    . COLONOSCOPY    . CORONARY ARTERY BYPASS GRAFT     coronary artery bypass grafting time 6 with left internal mammery to ythe left anterior descending coronary artery ,reverse saphenous vein  graft to the intermediate coronary artery sequential reverse saphenous vein graft to the first obtuse marginal and distal circumflex,sequential reverse saphenous vein graft to the acute  marginal and posterior descending coronary artery branch of the right corh  . ESOPHAGOGASTRODUODENOSCOPY (EGD) WITH ESOPHAGEAL DILATION N/A 08/29/2012   Procedure: ESOPHAGOGASTRODUODENOSCOPY (EGD) WITH ESOPHAGEAL DILATION;  Surgeon: Malissa Hippo, MD;  Location[facility];  Service: Endoscopy;  Laterality: N/A;  1200  . HERNIA REPAIR Right 2006   [facility]  . UPPER GASTROINTESTINAL ENDOSCOPY       Family History  Problem Relation Age of Onset  . Heart Problems Mother      Social History   Socioeconomic History  . Marital status: Married    Spouse name: [name]  . Number of children: 2  . Years of education: 7 th  . Highest education level: Not on file  Occupational History    Employer: RETIRED    Comment: retired  Tobacco Use  . Smoking status: Former Smoker    Packs/day: 3.00    Years: 30.00    Pack years: 90.00    Types: Cigarettes    Quit date: 05/20/1960    Years since quitting: 59.9  . Smokeless tobacco: Never Used  . Tobacco comment: Patient quit smoking in 1962  Substance and Sexual Activity  .  Alcohol use: No    Alcohol/week: 0.0 standard drinks  . Drug use: No  . Sexual activity: Not on file  Other Topics Concern  . Not on file  Social History Narrative   Patient  Lives at home with his wife [name])   Retired   Education 7 th grade   Right handed   Caffeine none   Social Determinants of Corporate investment banker Strain: Not on file  Food Insecurity: Not on file  Transportation Needs: Not on file  Physical Activity: Not on file  Stress: Not on file  Social Connections: Not on file  Intimate Partner Violence: Not on file     Pulse (!) 51   Ht 5\' 9"  (1.753 m)   Wt 131 lb 6.4 oz (59.6 kg)   SpO2 100%   BMI 19.40 kg/m   Physical Exam:  Well appearing  NAD HEENT: Unremarkable Neck:  No JVD, no thyromegally Lymphatics:  No adenopathy Back:  No CVA tenderness Lungs:  Clear with no wheezes HEART:  IRegular rate rhythm, no murmurs, no rubs, no clicks Abd:  soft, positive bowel sounds, no organomegally, no rebound, no guarding Ext:  2 plus pulses, no edema, no cyanosis, no clubbing Skin:  No rashes no nodules Neuro:  CN II through XII intact, motor grossly intact   Assess/Plan: 1. Atrial fib - his rates are controlled. He is not a candidate for anti-coagulation. 2. Dementia - he is calm today. He is not on aricept. I will defer this to primary team. 3. HTN -his bp is controlled.   Daiden Coltrane,MD

## 2020-04-24 NOTE — Patient Instructions (Signed)
Medication Instructions:  Your physician recommends that you continue on your current medications as directed. Please refer to the Current Medication list given to you today.  *If you need a refill on your cardiac medications before your next appointment, please call your pharmacy*   Lab Work: NONE   If you have labs (blood work) drawn today and your tests are completely normal, you will receive your results only by: . MyChart Message (if you have MyChart) OR . A paper copy in the mail If you have any lab test that is abnormal or we need to change your treatment, we will call you to review the results.   Testing/Procedures: NONE    Follow-Up: At [facility], you and your health needs are our priority.  As part of our continuing mission to provide you with exceptional heart care, we have created designated Provider Care Teams.  These Care Teams include your primary Cardiologist (physician) and Advanced Practice Providers (APPs -  Physician Assistants and Nurse Practitioners) who all work together to provide you with the care you need, when you need it.  We recommend signing up for the patient portal called "MyChart".  Sign up information is provided on this After Visit Summary.  MyChart is used to connect with patients for Virtual Visits (Telemedicine).  Patients are able to view lab/test results, encounter notes, upcoming appointments, etc.  Non-urgent messages can be sent to your provider as well.   To learn more about what you can do with MyChart, go to [email or web address].    Your next appointment:   1 year(s)  The format for your next appointment:   In Person  Provider:   Gregg Taylor, MD   Other Instructions Thank you for choosing [facility]!    

## 2020-05-01 DIAGNOSIS — F0151 Vascular dementia with behavioral disturbance: Secondary | ICD-10-CM | POA: Diagnosis not present

## 2020-05-01 DIAGNOSIS — G619 Inflammatory polyneuropathy, unspecified: Secondary | ICD-10-CM | POA: Diagnosis not present

## 2020-05-01 DIAGNOSIS — R2681 Unsteadiness on feet: Secondary | ICD-10-CM | POA: Diagnosis not present

## 2020-05-10 DIAGNOSIS — I129 Hypertensive chronic kidney disease with stage 1 through stage 4 chronic kidney disease, or unspecified chronic kidney disease: Secondary | ICD-10-CM | POA: Diagnosis not present

## 2020-05-10 DIAGNOSIS — N182 Chronic kidney disease, stage 2 (mild): Secondary | ICD-10-CM | POA: Diagnosis not present

## 2020-05-10 DIAGNOSIS — E1122 Type 2 diabetes mellitus with diabetic chronic kidney disease: Secondary | ICD-10-CM | POA: Diagnosis not present

## 2020-05-10 DIAGNOSIS — E039 Hypothyroidism, unspecified: Secondary | ICD-10-CM | POA: Diagnosis not present

## 2020-05-15 DIAGNOSIS — I482 Chronic atrial fibrillation, unspecified: Secondary | ICD-10-CM | POA: Diagnosis not present

## 2020-05-15 DIAGNOSIS — Z681 Body mass index (BMI) 19 or less, adult: Secondary | ICD-10-CM | POA: Diagnosis not present

## 2020-05-15 DIAGNOSIS — E1122 Type 2 diabetes mellitus with diabetic chronic kidney disease: Secondary | ICD-10-CM | POA: Diagnosis not present

## 2020-05-15 DIAGNOSIS — E1143 Type 2 diabetes mellitus with diabetic autonomic (poly)neuropathy: Secondary | ICD-10-CM | POA: Diagnosis not present

## 2020-05-15 DIAGNOSIS — G619 Inflammatory polyneuropathy, unspecified: Secondary | ICD-10-CM | POA: Diagnosis not present

## 2020-05-15 DIAGNOSIS — F0151 Vascular dementia with behavioral disturbance: Secondary | ICD-10-CM | POA: Diagnosis not present

## 2020-05-15 DIAGNOSIS — R300 Dysuria: Secondary | ICD-10-CM | POA: Diagnosis not present

## 2020-05-22 DIAGNOSIS — R4182 Altered mental status, unspecified: Secondary | ICD-10-CM | POA: Diagnosis not present

## 2020-05-22 DIAGNOSIS — Z681 Body mass index (BMI) 19 or less, adult: Secondary | ICD-10-CM | POA: Diagnosis not present

## 2020-05-22 DIAGNOSIS — W19XXXA Unspecified fall, initial encounter: Secondary | ICD-10-CM | POA: Diagnosis not present

## 2020-05-22 DIAGNOSIS — E860 Dehydration: Secondary | ICD-10-CM | POA: Diagnosis not present

## 2020-05-22 DIAGNOSIS — F0151 Vascular dementia with behavioral disturbance: Secondary | ICD-10-CM | POA: Diagnosis not present

## 2020-05-22 DIAGNOSIS — R29810 Facial weakness: Secondary | ICD-10-CM | POA: Diagnosis not present

## 2020-05-22 DIAGNOSIS — Z20822 Contact with and (suspected) exposure to covid-19: Secondary | ICD-10-CM | POA: Diagnosis not present

## 2020-05-22 DIAGNOSIS — R112 Nausea with vomiting, unspecified: Secondary | ICD-10-CM | POA: Diagnosis not present

## 2020-05-22 DIAGNOSIS — Z66 Do not resuscitate: Secondary | ICD-10-CM | POA: Diagnosis not present

## 2020-05-22 DIAGNOSIS — I10 Essential (primary) hypertension: Secondary | ICD-10-CM | POA: Diagnosis not present

## 2020-05-22 DIAGNOSIS — R531 Weakness: Secondary | ICD-10-CM | POA: Diagnosis not present

## 2020-05-22 DIAGNOSIS — E441 Mild protein-calorie malnutrition: Secondary | ICD-10-CM | POA: Diagnosis not present

## 2020-05-22 DIAGNOSIS — Z515 Encounter for palliative care: Secondary | ICD-10-CM | POA: Diagnosis not present

## 2020-05-22 DIAGNOSIS — N200 Calculus of kidney: Secondary | ICD-10-CM | POA: Diagnosis not present

## 2020-05-22 DIAGNOSIS — R456 Violent behavior: Secondary | ICD-10-CM | POA: Diagnosis not present

## 2020-06-01 DIAGNOSIS — G619 Inflammatory polyneuropathy, unspecified: Secondary | ICD-10-CM | POA: Diagnosis not present

## 2020-06-01 DIAGNOSIS — R2681 Unsteadiness on feet: Secondary | ICD-10-CM | POA: Diagnosis not present

## 2020-06-01 DIAGNOSIS — F0151 Vascular dementia with behavioral disturbance: Secondary | ICD-10-CM | POA: Diagnosis not present
